# Patient Record
Sex: Male | Born: 1970
Health system: Southern US, Community
[De-identification: ages and names within clinical notes are randomized; demographics above are authoritative.]

## PROBLEM LIST (undated history)

## (undated) DIAGNOSIS — J302 Other seasonal allergic rhinitis: Secondary | ICD-10-CM

## (undated) HISTORY — DX: Other seasonal allergic rhinitis: J30.2

---

## 1991-11-02 LAB — HM COLONOSCOPY

## 2011-07-19 ENCOUNTER — Encounter: Payer: Self-pay | Admitting: Internal Medicine

## 2011-07-19 ENCOUNTER — Ambulatory Visit (INDEPENDENT_AMBULATORY_CARE_PROVIDER_SITE_OTHER): Payer: Self-pay | Admitting: Internal Medicine

## 2011-07-19 DIAGNOSIS — Z0000 Encounter for general adult medical examination without abnormal findings: Secondary | ICD-10-CM | POA: Insufficient documentation

## 2011-07-19 LAB — CBC WITH DIFFERENTIAL/PLATELET
Basophils Absolute: 0 10*3/uL (ref 0.0–0.1)
HCT: 43.9 % (ref 39.0–52.0)
Hemoglobin: 14.7 g/dL (ref 13.0–17.0)
Lymphs Abs: 1.3 10*3/uL (ref 0.7–4.0)
MCV: 85.9 fl (ref 78.0–100.0)
Monocytes Absolute: 0.4 10*3/uL (ref 0.1–1.0)
Monocytes Relative: 5.8 % (ref 3.0–12.0)
Neutro Abs: 4.6 10*3/uL (ref 1.4–7.7)
Platelets: 227 10*3/uL (ref 150.0–400.0)
RDW: 13.2 % (ref 11.5–14.6)

## 2011-07-19 LAB — COMPREHENSIVE METABOLIC PANEL
ALT: 26 U/L (ref 0–53)
Alkaline Phosphatase: 50 U/L (ref 39–117)
CO2: 28 mEq/L (ref 19–32)
Creatinine, Ser: 1.2 mg/dL (ref 0.4–1.5)
GFR: 73.76 mL/min (ref >60.00)
Glucose, Bld: 89 mg/dL (ref 70–99)
Total Bilirubin: 0.8 mg/dL (ref 0.3–1.2)

## 2011-07-19 LAB — LIPID PANEL
Cholesterol: 186 mg/dL (ref 0–200)
HDL: 41.6 mg/dL (ref >39.00)
LDL Cholesterol: 117 mg/dL — ABNORMAL HIGH (ref 0–99)
VLDL: 27.4 mg/dL (ref 0.0–40.0)

## 2011-07-19 NOTE — Assessment & Plan Note (Addendum)
Td ~ 2003  EKG without acute changes Diet , exercise discussed  Labs Has a # of moles , saw derm last week, aware of how monitor them

## 2011-07-19 NOTE — Progress Notes (Signed)
  Subjective:    Patient ID: Robert [name], male    DOB: 05/15/1971, 40 y.o.   MRN: [id number]  HPI New pt, CPX Concerned about his wt, on average it has been 220, at some point dropped to 200 lb, now up to 227  Past Medical History  Diagnosis Date  . Asthma dx aprox 2002    exercise induced , rarely has sx   . Seasonal allergies    History reviewed. No pertinent past surgical history.  History   Social History  . Marital Status: Married    Spouse Name: N/A    Number of Children: 2  . Years of Education: N/A   Occupational History  . attorney (immigration)    Social History Main Topics  . Smoking status: Never Smoker   . Smokeless tobacco: Never Used  . Alcohol Use: Yes     rarely   . Drug Use: No  . Sexually Active: Not on file   Other Topics Concern  . Not on file   Social History Narrative   Exercise: on-off ---diet: eats out a lot    Family History  Problem Relation Age of Onset  . Rheum arthritis Mother   . Dystonia Mother   . COPD Father   . Alcohol abuse Father   . Arthritis Other   . Hyperlipidemia Other   . Heart disease Other     no early dz   . Hypertension Other   . Colon cancer Neg Hx   . Prostate cancer Neg Hx     Review of Systems  Constitutional: Negative for fever and fatigue.  Respiratory:       Some sx allergy related   Cardiovascular: Negative for chest pain and leg swelling.  Gastrointestinal: Negative for abdominal pain and blood in stool.  Genitourinary: Negative for hematuria and difficulty urinating.  Psychiatric/Behavioral:       No anxiety-depression       Objective:   Physical Exam  Constitutional: He is oriented to person, place, and time. He appears well-developed. No distress.  HENT:  Head: Normocephalic and atraumatic.  Neck: No thyromegaly present.       Nl carotid pulse   Cardiovascular: Normal rate, regular rhythm and normal heart sounds.   No murmur heard. Pulmonary/Chest: Effort normal and breath sounds  normal. No respiratory distress. He has no wheezes. He has no rales.  Abdominal: Soft. Bowel sounds are normal. He exhibits no distension. There is no tenderness. There is no rebound and no guarding.  Musculoskeletal: He exhibits no edema.  Neurological: He is alert and oriented to person, place, and time.  Skin: Skin is warm and dry. He is not diaphoretic.  Psychiatric: He has a normal mood and affect. Judgment and thought content normal.          Assessment & Plan:

## 2011-07-22 ENCOUNTER — Encounter: Payer: Self-pay | Admitting: *Deleted

## 2011-11-02 HISTORY — PX: VASECTOMY: SHX75

## 2012-06-28 ENCOUNTER — Ambulatory Visit (INDEPENDENT_AMBULATORY_CARE_PROVIDER_SITE_OTHER): Payer: BC Managed Care – PPO | Admitting: Internal Medicine

## 2012-06-28 ENCOUNTER — Encounter: Payer: Self-pay | Admitting: Internal Medicine

## 2012-06-28 VITALS — BP 110/82 | HR 63 | Temp 98.9°F | Wt 225.4 lb

## 2012-06-28 DIAGNOSIS — S6992XA Unspecified injury of left wrist, hand and finger(s), initial encounter: Secondary | ICD-10-CM

## 2012-06-28 DIAGNOSIS — S59909A Unspecified injury of unspecified elbow, initial encounter: Secondary | ICD-10-CM

## 2012-06-28 MED ORDER — TRAMADOL HCL 50 MG PO TABS
50.0000 mg | ORAL_TABLET | Freq: Four times a day (QID) | ORAL | Status: AC | PRN
Start: 2012-06-28 — End: 2012-07-08
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
## 2012-06-28 NOTE — Patient Instructions (Addendum)
Consider glucosamine sulfate 1500 mg daily for joint symptoms until seen. This will rehydrate the cartilages. Use an anti-inflammatory cream such as Aspercreme or Zostrix cream twice a day to the area as needed. In lieu of this warm moist compresses or  hot water bottle can be used. Do not apply ice .  Review and correct the record as indicated. Please share record with all medical staff seen.   If you activate My Chart; the results can be released to you as soon as they populate from the lab. If you choose not to use this program; the labs have to be reviewed, copied & mailed   causing a delay in getting the results to you.

## 2013-09-17 ENCOUNTER — Telehealth: Payer: Self-pay

## 2013-09-17 NOTE — Telephone Encounter (Signed)
Reviewed data with spouse on DPR Medication List and allergies: reviewed (no allergies or any medications)  90 day supply/mail order: na Local prescriptions: [facility] [address]  Immunizations due:  Tdap  A/P:   No changes to FH or PSH Flu vaccine 08/2013  To Discuss with Provider: Not at this time

## 2013-09-18 ENCOUNTER — Ambulatory Visit (INDEPENDENT_AMBULATORY_CARE_PROVIDER_SITE_OTHER): Payer: BC Managed Care – PPO | Admitting: Internal Medicine

## 2013-09-18 ENCOUNTER — Encounter: Payer: Self-pay | Admitting: Internal Medicine

## 2013-09-18 VITALS — BP 122/79 | HR 78 | Temp 98.3°F | Ht 72.4 in | Wt 232.0 lb

## 2013-09-18 DIAGNOSIS — Z0000 Encounter for general adult medical examination without abnormal findings: Secondary | ICD-10-CM

## 2013-09-18 DIAGNOSIS — Z23 Encounter for immunization: Secondary | ICD-10-CM

## 2013-09-18 NOTE — Progress Notes (Signed)
Pre visit review using our clinic review tool, if applicable. No additional management support is needed unless otherwise documented below in the visit note. 

## 2013-09-18 NOTE — Assessment & Plan Note (Signed)
Td today Had a flu shot  Diet , exercise discussed , making some progress  Labs Other issues: Resolved L testicular mild pain w/ (-) GU ROS; minimal discomfort today exam, no mass. We talk about ultrasound to be sure scrotal contents are completely normal versus observation with a low threshold to proceed with ultrasound. He elected to wait and see. He will let me know if sx resurface

## 2013-09-18 NOTE — Progress Notes (Signed)
  Subjective:    Patient ID: Robert [name], male    DOB: 02/23/1971, 42 y.o.   MRN: [id number]  HPI CPX Also ~ 3 weeks ago  had left testicular discomfort for 2 days, symptoms self resolved. Symptoms started shortly after he started to work out and wonders if that is related. There was no swelling and self-examination was normal.   Past Medical History  Diagnosis Date  . Asthma dx aprox 2002    exercise induced , rarely has sx   . Seasonal allergies    Past Surgical History  Procedure Laterality Date  . Vasectomy  2013   History   Social History  . Marital Status: Married    Spouse Name: N/A    Number of Children: 2  . Years of Education: N/A   Occupational History  . attorney (immigration)    Social History Main Topics  . Smoking status: Never Smoker   . Smokeless tobacco: Never Used  . Alcohol Use: Yes     Comment: rarely   . Drug Use: No  . Sexual Activity: Not on file   Other Topics Concern  . Not on file   Social History Narrative  . No narrative on file   \ Family History  Problem Relation Age of Onset  . Rheum arthritis Mother   . Dystonia Mother   . COPD Father   . Alcohol abuse Father   . Arthritis Other   . Hyperlipidemia Other   . Heart disease Other     no early dz   . Hypertension Other   . Colon cancer Neg Hx   . Prostate cancer Neg Hx      Review of Systems Diet-- average to healthy Exercise--  Routine, 30 min 5 times a week No  CP, SOB, lower extremity edema Denies  nausea, vomiting diarrhea Denies  blood in the stools (-) cough, sputum production, (-) wheezing, chest congestion  + seasonal allergies sometimes, no sx recently No dysuria, gross hematuria, difficulty urinating, GU rash, no d/c            Objective:   Physical Exam BP 122/79  Pulse 78  Temp(Src) 98.3 F (36.8 C)  Ht 6' 0.4" (1.839 m)  Wt 232 lb (105.235 kg)  BMI 31.12 kg/m2  SpO2 99% General -- alert, well-developed, NAD.  Neck --no thyromegaly  Lungs  -- normal respiratory effort, no intercostal retractions, no accessory muscle use, and normal breath sounds.  Heart-- normal rate, regular rhythm, no murmur.  Abdomen-- Not distended, good bowel sounds,soft, non-tender. GU-- Penis without rash or discharge. Close to the meatus he has two dark skin spots, unchanged for years according to the patient. Scrotal contents normal except for mild tenderness bilaterally. Extremities-- no pretibial edema bilaterally  Neurologic--  alert & oriented X3. Speech normal, gait normal, strength normal in all extremities. Psych-- Cognition and judgment appear intact. Cooperative with normal attention span and concentration. No anxious appearing , no depressed appearing.      Assessment & Plan:

## 2013-09-18 NOTE — Patient Instructions (Signed)
Schedule your blood work before you leave FLP, CMP, CBC, TSH ---  dx V70  Next visit in 1 year for a physical exam    If you need more information about a healthy diet, the American Heart Association is a great resource online at:  [employer]101.pl    Testicular Self-Exam A self-examination of your testicles involves looking at and feeling your testicles for abnormal lumps or swelling. Several things can cause swelling, lumps, or pain in your testicles. Some of these causes are:  Injuries.  Inflammation.  Infection.  Accumulation of fluids around your testicle (hydrocele).  Twisted testicles (testicular torsion).  Testicular cancer. Self-examination of the testicles and groin areas may be advised if you are at risk for testicular cancer. Risks for testicular cancer include:  An undescended testicle (cryptorchidism).  A history of previous testicular cancer.  A family history of testicular cancer. The testicles are easiest to examine after warm baths or showers and are more difficult to examine when you are cold. This is because the muscles attached to the testicles retract and pull them up higher or into the abdomen. Follow these steps while you are standing:  Hold your penis away from your body.  Roll one testicle between your thumb and forefinger, feeling the entire testicle.  Roll the other testicle between your thumb and forefinger, feeling the entire testicle. Feel for lumps, swelling, or discomfort. A normal testicle is egg shaped and feels firm. It is smooth and not tender. The spermatic cord can be felt as a firm spaghetti-like cord at the back of your testicle. It is also important to examine the crease between the front of your leg and your abdomen. Feel for any bumps that are tender. These could be enlarged lymph nodes.  Document Released: 01/24/2001 Document Revised: 06/20/2013 Document Reviewed: 04/09/2013 [facility] Patient Information 2014  [place], [place].

## 2013-09-19 ENCOUNTER — Other Ambulatory Visit (INDEPENDENT_AMBULATORY_CARE_PROVIDER_SITE_OTHER): Payer: BC Managed Care – PPO

## 2013-09-19 DIAGNOSIS — Z0000 Encounter for general adult medical examination without abnormal findings: Secondary | ICD-10-CM

## 2013-09-19 LAB — CBC WITH DIFFERENTIAL/PLATELET
Basophils Relative: 0.2 % (ref 0.0–3.0)
Eosinophils Absolute: 0.2 10*3/uL (ref 0.0–0.7)
Hemoglobin: 14.9 g/dL (ref 13.0–17.0)
Lymphocytes Relative: 18.2 % (ref 12.0–46.0)
MCHC: 34.6 g/dL (ref 30.0–36.0)
Neutro Abs: 5.4 10*3/uL (ref 1.4–7.7)
RBC: 5.24 Mil/uL (ref 4.22–5.81)
RDW: 13.4 % (ref 11.5–14.6)

## 2013-09-19 LAB — COMPREHENSIVE METABOLIC PANEL
ALT: 34 U/L (ref 0–53)
AST: 23 U/L (ref 0–37)
Alkaline Phosphatase: 47 U/L (ref 39–117)
BUN: 20 mg/dL (ref 6–23)
Calcium: 9.3 mg/dL (ref 8.4–10.5)
Chloride: 103 mEq/L (ref 96–112)
Creatinine, Ser: 1.2 mg/dL (ref 0.4–1.5)
Sodium: 137 mEq/L (ref 135–145)
Total Bilirubin: 0.9 mg/dL (ref 0.3–1.2)
Total Protein: 7.3 g/dL (ref 6.0–8.3)

## 2013-09-19 LAB — LIPID PANEL
Cholesterol: 188 mg/dL (ref 0–200)
HDL: 35.2 mg/dL — ABNORMAL LOW (ref >39.00)
LDL Cholesterol: 123 mg/dL — ABNORMAL HIGH (ref 0–99)
Triglycerides: 151 mg/dL — ABNORMAL HIGH (ref 0.0–149.0)
VLDL: 30.2 mg/dL (ref 0.0–40.0)

## 2013-09-19 LAB — TSH: TSH: 2.27 u[IU]/mL (ref 0.35–5.50)

## 2013-09-21 ENCOUNTER — Encounter: Payer: Self-pay | Admitting: *Deleted

## 2014-11-28 ENCOUNTER — Encounter: Payer: Self-pay | Admitting: Medical

## 2014-11-28 ENCOUNTER — Ambulatory Visit (INDEPENDENT_AMBULATORY_CARE_PROVIDER_SITE_OTHER): Payer: 59 | Admitting: Medical

## 2014-11-28 VITALS — BP 122/80 | HR 77 | Temp 98.2°F | Ht 72.4 in | Wt 210.0 lb

## 2014-11-28 DIAGNOSIS — H6507 Acute serous otitis media, recurrent, unspecified ear: Secondary | ICD-10-CM

## 2014-11-28 MED ORDER — AMOXICILLIN-POT CLAVULANATE 875-125 MG PO TABS: 1.0000 | ORAL_TABLET | Freq: Two times a day (BID) | ORAL | Status: DC

## 2014-11-28 MED ORDER — FLUTICASONE PROPIONATE 50 MCG/ACT NA SUSP: 2.0000 | Freq: Every day | NASAL | Status: DC

## 2014-11-28 NOTE — Assessment & Plan Note (Signed)
With your recent nasal congestion, scuba diving and air flights, you have likley eustachian tube dysfunction along with bilateral OM. Your rt tm looks worse.(Your TM are intact).  I am prescribing flonase nasal spray and augmentin antibiotic.

## 2014-11-28 NOTE — Patient Instructions (Signed)
With your recent nasal congestion, scuba diving and air flights, you have likley eustachian tube dysfunction along with bilateral OM. Your rt tm looks worse.(Your TM are intact).  I am prescribing flonase nasal spray and augmentin antibiotic.  Follow up in 7 days or as needed.

## 2014-11-28 NOTE — Progress Notes (Signed)
Subjective:    Patient ID: Robert [name], male    DOB: 04/14/1971, 44 y.o.   MRN: [id number]  HPI   Pt in states he has some ear pressure. He was scuba diving and he felt ear pressure while scuba diving and he felt like he did not come up correctly. He did not follow instruction. He also had some mild congestion at the time. Pt got [place] from [place] last [place]. Sunday and Monday he scuba dived.   He flew back last night.  No fever or chills. Pt hearing intact.   Past Medical History  Diagnosis Date  . Asthma dx aprox 2002    exercise induced , rarely has sx   . Seasonal allergies     History   Social History  . Marital Status: Married    Spouse Name: N/A    Number of Children: 2  . Years of Education: N/A   Occupational History  . attorney (immigration)    Social History Main Topics  . Smoking status: Never Smoker   . Smokeless tobacco: Never Used  . Alcohol Use: Yes     Comment: rarely   . Drug Use: No  . Sexual Activity: Not on file   Other Topics Concern  . Not on file   Social History Narrative    Past Surgical History  Procedure Laterality Date  . Vasectomy  2013    Family History  Problem Relation Age of Onset  . Rheum arthritis Mother   . Dystonia Mother   . COPD Father   . Alcohol abuse Father   . Arthritis Other   . Hyperlipidemia Other   . Heart disease Other     no early dz   . Hypertension Other   . Colon cancer Neg Hx   . Prostate cancer Neg Hx     No Known Allergies  No current outpatient prescriptions on file prior to visit.   No current facility-administered medications on file prior to visit.    BP 122/80 mmHg  Pulse 68  Temp(Src) 98.2 F (36.8 C) (Oral)  Ht 6' 0.4" (1.839 m)  Wt 210 lb (95.255 kg)  BMI 28.17 kg/m2  SpO2 98%        Review of Systems  Constitutional: Negative for fever, chills and fatigue.  HENT: Positive for congestion and ear pain. Negative for mouth sores, postnasal drip,  sinus pressure, sneezing, sore throat, tinnitus and voice change.        More pressure on both sides ears.  Respiratory: Negative for cough, chest tightness, shortness of breath and wheezing.   Cardiovascular: Negative for chest pain and palpitations.  Musculoskeletal: Negative for back pain.  Neurological: Negative for dizziness, tremors, speech difficulty, numbness and headaches.  Psychiatric/Behavioral: Negative for behavioral problems and confusion.       Objective:   Physical Exam   General  Mental Status - Alert. General Appearance - Well groomed. Not in acute distress.  Skin Rashes- No Rashes.  HEENT Head- Normal. Ear Auditory Canal - Left- Normal. Right - Normal.Tympanic Membrane- Left- mild dull lt tm. Right- Very bright red tm. Tm intact.  Eye Sclera/Conjunctiva- Left- Normal. Right- Normal. Nose & Sinuses Nasal Mucosa- Left-  Boggy and Congested. Right-  Boggy and  Congested.No bilateral maxillary but no frontal sinus pressure. Mouth & Throat Lips: Upper Lip- Normal: no dryness, cracking, pallor, cyanosis, or vesicular eruption. Lower Lip-Normal: no dryness, cracking, pallor, cyanosis or vesicular eruption. Buccal Mucosa- Bilateral- No Aphthous  ulcers. Oropharynx- No Discharge or Erythema. Tonsils: Characteristics- Bilateral- No Erythema or Congestion. Size/Enlargement- Bilateral- No enlargement. Discharge- bilateral-None.  Neck Neck- Supple. No Masses.   Chest and Lung Exam Auscultation: Breath Sounds:-Clear even and unlabored.  Cardiovascular Auscultation:Rythm- Regular, rate and rhythm. Murmurs & Other Heart Sounds:Ausculatation of the heart reveal- No Murmurs.  Lymphatic Head & Neck General Head & Neck Lymphatics: Bilateral: Description- No Localized lymphadenopathy.         Assessment & Plan:

## 2014-11-28 NOTE — Progress Notes (Signed)
Pre visit review using our clinic review tool, if applicable. No additional management support is needed unless otherwise documented below in the visit note. 

## 2016-09-01 ENCOUNTER — Encounter: Payer: Self-pay | Admitting: Family

## 2016-09-01 ENCOUNTER — Ambulatory Visit (INDEPENDENT_AMBULATORY_CARE_PROVIDER_SITE_OTHER): Payer: 59 | Admitting: Family

## 2016-09-01 VITALS — BP 128/81 | HR 68 | Temp 98.4°F | Resp 18 | Ht 71.75 in | Wt 226.0 lb

## 2016-09-01 DIAGNOSIS — M109 Gout, unspecified: Secondary | ICD-10-CM | POA: Diagnosis not present

## 2016-09-01 DIAGNOSIS — Z23 Encounter for immunization: Secondary | ICD-10-CM

## 2016-09-01 DIAGNOSIS — B353 Tinea pedis: Secondary | ICD-10-CM | POA: Diagnosis not present

## 2016-09-01 LAB — URIC ACID: URIC ACID, SERUM: 5.5 mg/dL (ref 4.0–7.8)

## 2016-09-01 MED ORDER — COLCHICINE 0.6 MG PO TABS: ORAL_TABLET | ORAL | 0 refills | Status: DC

## 2016-09-01 MED ORDER — KETOROLAC TROMETHAMINE 60 MG/2ML IM SOLN
60.0000 mg | Freq: Once | INTRAMUSCULAR | Status: AC
  Administered 2016-09-01: 60 mg via INTRAMUSCULAR

## 2016-09-01 NOTE — Patient Instructions (Signed)
Please begin colchicine this evening. Try to avoid shellfish and beef and follow a gout diet. Call if new/worsening symptoms or if not improved in 2 days. Complete lab work prior to leaving. For athlete's foot you can purchase lamisil spray over the counter and apply to both feet twice daily.

## 2016-09-01 NOTE — Progress Notes (Signed)
   Subjective:    Patient ID: Robert [name], male    DOB: 04/18/1971, 45 y.o.   MRN: [id number]  HPI  Robert [name] is a 45 yr old male who presents today with chief complaint of left foot pain.  Pain began yesterday.  Notes that he started the "whole 30" diet pain 18 days ago. Reports that his pain is quite significant.  Tried ibuprofen which did not help.   Review of Systems    see HPI  Past Medical History:  Diagnosis Date  . Asthma dx aprox 2002   exercise induced , rarely has sx   . Seasonal allergies      Social History   Social History  . Marital status: Married    Spouse name: N/A  . Number of children: 2  . Years of education: N/A   Occupational History  . attorney (immigration)    Social History Main Topics  . Smoking status: Never Smoker  . Smokeless tobacco: Never Used  . Alcohol use Yes     Comment: rarely   . Drug use: No  . Sexual activity: Not on file   Other Topics Concern  . Not on file   Social History Narrative  . No narrative on file    Past Surgical History:  Procedure Laterality Date  . VASECTOMY  2013    Family History  Problem Relation Age of Onset  . Arthritis Other   . Hyperlipidemia Other   . Heart disease Other     no early dz   . Hypertension Other   . Rheum arthritis Mother   . Dystonia Mother   . COPD Father   . Alcohol abuse Father   . Colon cancer Neg Hx   . Prostate cancer Neg Hx     No Known Allergies  No current outpatient prescriptions on file prior to visit.   No current facility-administered medications on file prior to visit.     BP 128/81 (BP Location: Right Arm, Patient Position: Sitting, Cuff Size: Normal)   Pulse 68   Temp 98.4 F (36.9 C) (Oral)   Resp 18   Ht 5' 11.75" (1.822 m)   Wt 226 lb (102.5 kg)   SpO2 99% Comment: RA  BMI 30.87 kg/m    Objective:   Physical Exam  Constitutional: He is oriented to person, place, and time. He appears well-developed and well-nourished. No distress.   HENT:  Head: Normocephalic and atraumatic.  Cardiovascular: Normal rate and regular rhythm.   No murmur heard. Pulmonary/Chest: Effort normal and breath sounds normal. No respiratory distress. He has no wheezes. He has no rales.  Musculoskeletal: He exhibits no edema.  Mild erythema at the base of the left great toe.   Neurological: He is alert and oriented to person, place, and time.  Skin: Skin is warm and dry.  Dry peeling skin noted on left foot.   Psychiatric: He has a normal mood and affect. His behavior is normal. Thought content normal.          Assessment & Plan:  Tinea pedis- recommended otc lamisil spray.   Gout- new. Pt given IM toradol today in the office. He will take colchicine when he gets home this evening. I advised him that this will cause him some brief diarrhea.  We discussed low purine diet.  He is advised to call if new/worsening symptoms or if symptoms do not improve. Check uric acid level.

## 2016-09-01 NOTE — Progress Notes (Signed)
Pre visit review using our clinic review tool, if applicable. No additional management support is needed unless otherwise documented below in the visit note. 

## 2016-09-10 ENCOUNTER — Encounter: Payer: Self-pay | Admitting: Internal Medicine

## 2016-09-10 ENCOUNTER — Ambulatory Visit (INDEPENDENT_AMBULATORY_CARE_PROVIDER_SITE_OTHER): Payer: 59 | Admitting: Internal Medicine

## 2016-09-10 VITALS — BP 120/78 | HR 76 | Temp 98.7°F | Resp 14 | Ht 71.75 in | Wt 223.4 lb

## 2016-09-10 DIAGNOSIS — Z09 Encounter for follow-up examination after completed treatment for conditions other than malignant neoplasm: Secondary | ICD-10-CM | POA: Insufficient documentation

## 2016-09-10 DIAGNOSIS — R05 Cough: Secondary | ICD-10-CM

## 2016-09-10 DIAGNOSIS — R059 Cough, unspecified: Secondary | ICD-10-CM

## 2016-09-10 MED ORDER — AZELASTINE HCL 0.1 % NA SOLN: 2.0000 | Freq: Every evening | NASAL | 3 refills | Status: DC | PRN

## 2016-09-10 MED ORDER — AZITHROMYCIN 250 MG PO TABS: ORAL_TABLET | ORAL | 0 refills | Status: DC

## 2016-09-10 MED ORDER — BENZONATATE 200 MG PO CAPS: 200.0000 mg | ORAL_CAPSULE | Freq: Three times a day (TID) | ORAL | 0 refills | Status: DC | PRN

## 2016-09-10 NOTE — Assessment & Plan Note (Signed)
Cough: Persistent for 10 days, DDX allergies with PND, atypical infections, others. Conservative treatment first, see instructions, if not better will get an antibiotic. No evidence of asthma exacerbation on today's visit. Also, RTC at his convenience for a CPX

## 2016-09-10 NOTE — Progress Notes (Signed)
Pre visit review using our clinic review tool, if applicable. No additional management support is needed unless otherwise documented below in the visit note. 

## 2016-09-10 NOTE — Progress Notes (Signed)
Subjective:    Patient ID: Robert [name], male    DOB: 06/30/1971, 45 y.o.   MRN: [id number]  DOS:  09/10/2016 Type of visit Symptoms started ~ 10 days ago, is mostly spells of cough, often times at night, was unable to sleep well last night. No sputum production. Wife had a URI. Has tried Benadryl, Flonase and Mucinex   Review of Systems Denies fever chills As far as allergies, he has some postnasal dripping but is not congesting, no itchy eyes or nose, no sneezing. Denies nausea, vomiting, or aches like a flu syndrome. No wheezing or heartburn  Past Medical History:  Diagnosis Date  . Asthma dx aprox 2002   exercise induced , rarely has sx   . Seasonal allergies     Past Surgical History:  Procedure Laterality Date  . VASECTOMY  2013    Social History   Social History  . Marital status: Married    Spouse name: N/A  . Number of children: 2  . Years of education: N/A   Occupational History  . attorney (immigration)    Social History Main Topics  . Smoking status: Never Smoker  . Smokeless tobacco: Never Used  . Alcohol use Yes     Comment: rarely   . Drug use: No  . Sexual activity: Not on file   Other Topics Concern  . Not on file   Social History Narrative  . No narrative on file        Medication List       Accurate as of 09/10/16  5:02 PM. Always use your most recent med list.          azelastine 0.1 % nasal spray Commonly known as:  ASTELIN Place 2 sprays into both nostrils at bedtime as needed for rhinitis. Use in each nostril as directed   azithromycin 250 MG tablet Commonly known as:  ZITHROMAX Z-PAK 2 tabs a day the first day, then 1 tab a day x 4 days   benzonatate 200 MG capsule Commonly known as:  TESSALON Take 1 capsule (200 mg total) by mouth 3 (three) times daily as needed for cough.   colchicine 0.6 MG tablet 2 tabs today, followed by 1 tab one hour later.  May repeat tomorrow if symptoms not improved.            Objective:   Physical Exam BP 120/78 (BP Location: Left Arm, Patient Position: Sitting, Cuff Size: Normal)   Pulse 76   Temp 98.7 F (37.1 C) (Oral)   Resp 14   Ht 5' 11.75" (1.822 m)   Wt 223 lb 6 oz (101.3 kg)   SpO2 97%   BMI 30.51 kg/m  General:   Well developed, well nourished . NAD.  HEENT:  Normocephalic . Face symmetric, atraumatic. TMs normal. Throat symmetric and not red. Nose slightly congested, sinuses no TTP Lungs:  CTA B Normal respiratory effort, no intercostal retractions, no accessory muscle use. Heart: RRR,  no murmur.  No pretibial edema bilaterally  Skin: Not pale. Not jaundice Neurologic:  alert & oriented X3.  Speech normal, gait appropriate for age and unassisted Psych--  Cognition and judgment appear intact.  Cooperative with normal attention span and concentration.  Behavior appropriate. No anxious or depressed appearing.      Assessment & Plan:   Assessment Asthma [facility] 09-2016 Seasonal allergies  PLAN: Cough: Persistent for 10 days, DDX allergies with PND, atypical infections, others. Conservative treatment first, see instructions, if not  better will get an antibiotic. No evidence of asthma exacerbation on today's visit. Also, RTC at his convenience for a CPX

## 2016-09-10 NOTE — Patient Instructions (Signed)
  For cough:  Take Mucinex DM twice a day as needed until better If the cough continue, try Tessalon perless   For nasal congestion: Use OTC Nasocort or Flonase : 2 nasal sprays on each side of the nose in the morning until you feel better Use ASTELIN a prescribed spray : 2 nasal sprays on each side of the nose at night until you feel better   Take the antibiotic as prescribed  (zithromax) if not better in 3-4 days   Call if not gradually better over the next  10 days  Call anytime if the symptoms are severe

## 2016-11-26 ENCOUNTER — Encounter: Payer: Self-pay | Admitting: Internal Medicine

## 2016-11-26 ENCOUNTER — Ambulatory Visit (INDEPENDENT_AMBULATORY_CARE_PROVIDER_SITE_OTHER): Payer: 59 | Admitting: Internal Medicine

## 2016-11-26 VITALS — BP 118/66 | HR 76 | Temp 97.6°F | Resp 14 | Ht 72.0 in | Wt 230.5 lb

## 2016-11-26 DIAGNOSIS — Z0000 Encounter for general adult medical examination without abnormal findings: Secondary | ICD-10-CM | POA: Diagnosis not present

## 2016-11-26 LAB — CBC WITH DIFFERENTIAL/PLATELET
BASOS ABS: 0 10*3/uL (ref 0.0–0.1)
BASOS PCT: 0.3 % (ref 0.0–3.0)
EOS ABS: 0.1 10*3/uL (ref 0.0–0.7)
Eosinophils Relative: 1.8 % (ref 0.0–5.0)
HEMATOCRIT: 43 % (ref 39.0–52.0)
HEMOGLOBIN: 14.8 g/dL (ref 13.0–17.0)
LYMPHS PCT: 23.5 % (ref 12.0–46.0)
Lymphs Abs: 1.6 10*3/uL (ref 0.7–4.0)
MCHC: 34.3 g/dL (ref 30.0–36.0)
MCV: 83.1 fl (ref 78.0–100.0)
Monocytes Absolute: 0.4 10*3/uL (ref 0.1–1.0)
Monocytes Relative: 6.1 % (ref 3.0–12.0)
NEUTROS ABS: 4.6 10*3/uL (ref 1.4–7.7)
Neutrophils Relative %: 68.3 % (ref 43.0–77.0)
PLATELETS: 218 10*3/uL (ref 150.0–400.0)
RBC: 5.18 Mil/uL (ref 4.22–5.81)
RDW: 12.9 % (ref 11.5–15.5)
WBC: 6.7 10*3/uL (ref 4.0–10.5)

## 2016-11-26 LAB — COMPREHENSIVE METABOLIC PANEL
ALBUMIN: 4.4 g/dL (ref 3.5–5.2)
ALT: 27 U/L (ref 0–53)
AST: 19 U/L (ref 0–37)
Alkaline Phosphatase: 48 U/L (ref 39–117)
BILIRUBIN TOTAL: 0.6 mg/dL (ref 0.2–1.2)
BUN: 19 mg/dL (ref 6–23)
CALCIUM: 9.3 mg/dL (ref 8.4–10.5)
CHLORIDE: 103 meq/L (ref 96–112)
CO2: 32 meq/L (ref 19–32)
Creatinine, Ser: 1.17 mg/dL (ref 0.40–1.50)
GFR: 71.24 mL/min (ref >60.00)
Glucose, Bld: 92 mg/dL (ref 70–99)
Potassium: 4.1 mEq/L (ref 3.5–5.1)
Sodium: 140 mEq/L (ref 135–145)
Total Protein: 7.3 g/dL (ref 6.0–8.3)

## 2016-11-26 LAB — LIPID PANEL
CHOLESTEROL: 191 mg/dL (ref 0–200)
HDL: 40.6 mg/dL (ref >39.00)
LDL CALC: 134 mg/dL — AB (ref 0–99)
NonHDL: 150.26
TRIGLYCERIDES: 81 mg/dL (ref 0.0–149.0)
Total CHOL/HDL Ratio: 5
VLDL: 16.2 mg/dL (ref 0.0–40.0)

## 2016-11-26 LAB — URIC ACID: Uric Acid, Serum: 5.9 mg/dL (ref 4.0–7.8)

## 2016-11-26 LAB — TSH: TSH: 2.11 u[IU]/mL (ref 0.35–4.50)

## 2016-11-26 NOTE — Patient Instructions (Signed)
GO TO THE LAB : Get the blood work     GO TO THE FRONT DESK Schedule your next appointment for a physical in 2 years

## 2016-11-26 NOTE — Progress Notes (Signed)
Pre visit review using our clinic review tool, if applicable. No additional management support is needed unless otherwise documented below in the visit note. 

## 2016-11-26 NOTE — Assessment & Plan Note (Addendum)
Td 2014, had a Flu shot  CCS: ?sigmoidoscopy in the 90s, no Cscope  Prostate ca screening- no FH,not indicated He is active and is trying to eat healthy. Feeling well. Labs: CMP, FLP, CBC, TSH, HIV, uric acid. RTC 2 years, sooner if needed. Recommend a flu shot yearly

## 2016-11-26 NOTE — Progress Notes (Signed)
Subjective:    Patient ID: Robert [name], male    DOB: May 04, 1971, 46 y.o.   MRN: [id number]  DOS:  11/26/2016 Type of visit - description : CPX Interval history: No concerns, doing great, lifestyle has improved   Review of Systems Constitutional: No fever. No chills. No unexplained wt changes. No unusual sweats  HEENT: No dental problems, no ear discharge, no facial swelling, no voice changes. No eye discharge, no eye  redness , no  intolerance to light   Respiratory: No wheezing , no  difficulty breathing. No cough , no mucus production  Cardiovascular: No CP, no leg swelling , no  Palpitations  GI: no nausea, no vomiting, no diarrhea , no  abdominal pain.  No blood in the stools. No dysphagia, no odynophagia    Endocrine: No polyphagia, no polyuria , no polydipsia  GU: No dysuria, gross hematuria, difficulty urinating. No urinary urgency, no frequency.  Musculoskeletal: No joint swellings or unusual aches or pains  Skin: No change in the color of the skin, palor , no  Rash  Allergic, immunologic: No environmental allergies , no  food allergies  Neurological: No dizziness no  syncope. No headaches. No diplopia, no slurred, no slurred speech, no motor deficits, no facial  Numbness  Hematological: No enlarged lymph nodes, no easy bruising , no unusual bleedings  Psychiatry: No suicidal ideas, no hallucinations, no beavior problems, no confusion.  No unusual/severe anxiety, no depression   Past Medical History:  Diagnosis Date  . Asthma dx aprox 2002   exercise induced , rarely has sx   . Seasonal allergies     Past Surgical History:  Procedure Laterality Date  . VASECTOMY  2013    Social History   Social History  . Marital status: Married    Spouse name: N/A  . Number of children: 2  . Years of education: N/A   Occupational History  . attorney (immigration)    Social History Main Topics  . Smoking status: Never Smoker  . Smokeless tobacco: Never  Used  . Alcohol use Yes     Comment: rarely   . Drug use: No  . Sexual activity: Not on file   Other Topics Concern  . Not on file   Social History Narrative  . No narrative on file     Family History  Problem Relation Age of Onset  . Arthritis Other   . Hyperlipidemia Other   . Heart disease Other     no early dz   . Hypertension Other   . Rheum arthritis Mother   . Dystonia Mother   . COPD Father   . Alcohol abuse Father   . Colon cancer Neg Hx   . Prostate cancer Neg Hx      Allergies as of 11/26/2016   No Known Allergies     Medication List       Accurate as of 11/26/16 11:59 PM. Always use your most recent med list.          colchicine 0.6 MG tablet 2 tabs today, followed by 1 tab one hour later.  May repeat tomorrow if symptoms not improved.          Objective:   Physical Exam BP 118/66 (BP Location: Left Arm, Patient Position: Sitting, Cuff Size: Normal)   Pulse 76   Temp 97.6 F (36.4 C) (Oral)   Resp 14   Ht 6' (1.829 m)   Wt 230 lb 8 oz (104.6 kg)  SpO2 98%   BMI 31.26 kg/m   General:   Well developed, well nourished . NAD.  Neck: No  thyromegaly  HEENT:  Normocephalic . Face symmetric, atraumatic Lungs:  CTA B Normal respiratory effort, no intercostal retractions, no accessory muscle use. Heart: RRR,  no murmur.  No pretibial edema bilaterally  Abdomen:  Not distended, soft, non-tender. No rebound or rigidity.   Skin: Exposed areas without rash. Not pale. Not jaundice Neurologic:  alert & oriented X3.  Speech normal, gait appropriate for age and unassisted Strength symmetric and appropriate for age.  Psych: Cognition and judgment appear intact.  Cooperative with normal attention span and concentration.  Behavior appropriate. No anxious or depressed appearing.    Assessment & Plan:    Assessment Asthma, dx 2012, exercise induced  Podagra 09-2016 Seasonal allergies  PLAN: Asthma: Not an issue at this point Podagra:  No recent events, will check a uric acid RTC 2 years

## 2016-11-27 LAB — HIV ANTIBODY (ROUTINE TESTING W REFLEX): HIV: NONREACTIVE

## 2016-11-28 NOTE — Assessment & Plan Note (Signed)
Asthma: Not an issue at this point Podagra: No recent events, will check a uric acid RTC 2 years

## 2017-04-05 ENCOUNTER — Encounter: Payer: Self-pay | Admitting: Family

## 2017-04-05 ENCOUNTER — Ambulatory Visit (INDEPENDENT_AMBULATORY_CARE_PROVIDER_SITE_OTHER): Payer: 59 | Admitting: Family

## 2017-04-05 VITALS — BP 126/82 | HR 54 | Temp 98.4°F | Resp 18 | Ht 72.0 in | Wt 230.6 lb

## 2017-04-05 DIAGNOSIS — N529 Male erectile dysfunction, unspecified: Secondary | ICD-10-CM

## 2017-04-05 DIAGNOSIS — R6882 Decreased libido: Secondary | ICD-10-CM

## 2017-04-05 DIAGNOSIS — E669 Obesity, unspecified: Secondary | ICD-10-CM

## 2017-04-05 LAB — TSH: TSH: 2.03 u[IU]/mL (ref 0.35–4.50)

## 2017-04-05 MED ORDER — SILDENAFIL CITRATE 50 MG PO TABS: 50.0000 mg | ORAL_TABLET | Freq: Every day | ORAL | 2 refills | Status: DC | PRN

## 2017-04-05 NOTE — Patient Instructions (Signed)
Please complete lab work prior to leaving. You may use 1/2 to 1 tablet of viagra as needed.
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
## 2017-04-06 LAB — TESTOSTERONE TOTAL,FREE,BIO, MALES
ALBUMIN: 4.4 g/dL (ref 3.6–5.1)
Sex Hormone Binding: 26 nmol/L (ref 10–50)
TESTOSTERONE FREE: 62.9 pg/mL (ref 46.0–224.0)
TESTOSTERONE: 395 ng/dL (ref 250–827)
Testosterone, Bioavailable: 126.6 ng/dL (ref 110.0–575.0)

## 2017-04-07 ENCOUNTER — Encounter: Payer: Self-pay | Admitting: Family

## 2017-06-01 ENCOUNTER — Telehealth: Payer: 59 | Admitting: Family

## 2017-06-01 ENCOUNTER — Ambulatory Visit: Payer: 59 | Admitting: Internal Medicine

## 2017-06-01 DIAGNOSIS — M791 Myalgia, unspecified site: Secondary | ICD-10-CM

## 2017-06-01 DIAGNOSIS — R300 Dysuria: Secondary | ICD-10-CM

## 2017-06-01 NOTE — Progress Notes (Signed)
We are sorry that you are not feeling well.  Here is how we plan to help!  Male bladder infections are not very common.  We worry about prostate or kidney conditions.  The standard of care is to examine the abdomen and kidneys, and to do a urine and blood test to make sure that something more serious is not going on.  We recommend that you see a provider today.  If your doctor's office is closed [facility] has the following Urgent Cares:  If you need care fast and have a high deductible or no insurance consider:   [email or web address]  [phone]  [address], [address] [place], [place] [address] 8 am to 8 pm Monday-Friday 10 am to 4 pm Saturday-Sunday   The following sites will take your  insurance:    . [facility]  [phone] Get Driving Directions Find a Provider at this Location  [address] [place], [place] [address] . 10 am to 8 pm Monday-Friday . 12 pm to 8 pm Saturday-Sunday   . [facility]  [phone] Get Driving Directions Find a Provider at this Location  [address], [address] [place], [place] [address] . 8 am to 8 pm Monday-Friday . 9 am to 6 pm Saturday . 11 am to 6 pm Sunday   . [facility] [place]  [phone] Get Driving Directions  [address] [place], [place] [address] . 8 am to 8 pm Monday-Friday . 8 am to 4 pm Saturday-Sunday   . [facility] (a walk in primary care provider)  [phone]  Get Driving Directions Find a Provider at this Location  [address] , [place] [address] . 8 am to 8:30 pm Monday-Thursday . 8 am to 6 pm Friday . 8 am to 4 pm Saturday-Sunday   Your e-visit answers were reviewed by a board certified advanced clinical practitioner to complete your personal care plan.  Thank you for using e-Visits.   

## 2017-06-02 ENCOUNTER — Telehealth: Payer: Self-pay | Admitting: Emergency Medicine

## 2017-06-02 NOTE — Telephone Encounter (Signed)
Pre visit attempted left voicemail for pt to return call to office.  

## 2017-06-03 ENCOUNTER — Ambulatory Visit (INDEPENDENT_AMBULATORY_CARE_PROVIDER_SITE_OTHER): Payer: 59 | Admitting: Family Medicine

## 2017-06-03 ENCOUNTER — Encounter: Payer: Self-pay | Admitting: Family Medicine

## 2017-06-03 VITALS — BP 134/80 | HR 98 | Temp 98.5°F | Ht 72.0 in | Wt 230.2 lb

## 2017-06-03 DIAGNOSIS — R509 Fever, unspecified: Secondary | ICD-10-CM | POA: Diagnosis not present

## 2017-06-03 DIAGNOSIS — R300 Dysuria: Secondary | ICD-10-CM

## 2017-06-03 DIAGNOSIS — R351 Nocturia: Secondary | ICD-10-CM | POA: Diagnosis not present

## 2017-06-03 LAB — POCT URINALYSIS DIPSTICK
Bilirubin, UA: NEGATIVE
Blood, UA: NEGATIVE
GLUCOSE UA: NEGATIVE
Ketones, UA: NEGATIVE
LEUKOCYTES UA: NEGATIVE
NITRITE UA: NEGATIVE
Protein, UA: 15
Spec Grav, UA: 1.015 (ref 1.010–1.025)
Urobilinogen, UA: 1 E.U./dL
pH, UA: 7 (ref 5.0–8.0)

## 2017-06-03 NOTE — Patient Instructions (Signed)
Please let me know if symptoms do not continue to improve or if the worsen If any fever over 101, nausea or vomiting over weekend- go to ER or urgent care

## 2017-06-03 NOTE — Progress Notes (Signed)
Subjective:    Patient ID: Robert [name], male    DOB: 11/20/1970, 46 y.o.   MRN: [id number]  HPI This is a 46 yo male who presents today to establish care and for concern for nocturia. He has seen Dr. Drue NovelPaz in the past but lives in [place] and this location is more convenient for him. He is an Air cabin crewimmigration lawyer. He is married with two children (teenagers).   Started 5 days ago with strange sensation with urine and frequency. Four days ago had clamminess/aches which resolved with ibuprofen. No back pain, no lower abdominal pain, no nausea or vomiting. No difficulty with stream or starting stream or emptying bladder. Had some urgency over 2 days. Had small amount of urinary incontinence 5 days ago. Does not recall having similar symptoms in the past. Has history of urethral meatus injury since college. Like a cut on inside of urethral meatus. Loraine LericheMark remains, no symptoms typically.  Did have some soreness of penis after intercourse with wife this past weekend. Wife with some similar urinary frequency that started today. No recent vaginal discharge or infection. In monogamous relationship with wife.      Past Medical History:  Diagnosis Date  . Asthma dx aprox 2002   exercise induced , rarely has sx   . Seasonal allergies    Past Surgical History:  Procedure Laterality Date  . VASECTOMY  2013   Family History  Problem Relation Age of Onset  . Arthritis Other   . Hyperlipidemia Other   . Heart disease Other        no early dz   . Hypertension Other   . Rheum arthritis Mother   . Dystonia Mother   . COPD Father   . Alcohol abuse Father   . Colon cancer Neg Hx   . Prostate cancer Neg Hx    Social History  Substance Use Topics  . Smoking status: Never Smoker  . Smokeless tobacco: Never Used  . Alcohol use Yes     Comment: rarely       Review of Systems  Constitutional: Positive for fever (low grade). Negative for chills.  Gastrointestinal: Negative for abdominal pain,  nausea and vomiting.  Endocrine: Negative.   Genitourinary: Positive for dysuria (not pain, feels "weird"), frequency and urgency. Negative for decreased urine volume, difficulty urinating, discharge, flank pain, hematuria, penile pain, penile swelling and testicular pain.  Musculoskeletal: Positive for myalgias (improved now).       Objective:   Physical Exam  Constitutional: He is oriented to person, place, and time. He appears well-developed and well-nourished. No distress.  HENT:  Head: Normocephalic and atraumatic.  Eyes: Conjunctivae are normal.  Cardiovascular: Normal rate, regular rhythm and normal heart sounds.   Pulmonary/Chest: Effort normal and breath sounds normal.  Abdominal: Soft. Bowel sounds are normal. He exhibits no distension. There is no tenderness. There is no rebound, no guarding and no CVA tenderness. Hernia confirmed negative in the right inguinal area and confirmed negative in the left inguinal area.  Genitourinary: Rectum normal and prostate normal. Right testis shows no mass, no swelling and no tenderness. Left testis shows no mass, no swelling and no tenderness. No penile erythema or penile tenderness. No discharge found.  Genitourinary Comments: 2 mm area increased pigmentation on right side of urethral meatus.   Lymphadenopathy:       Right: No inguinal adenopathy present.       Left: No inguinal adenopathy present.  Neurological: He is alert and oriented  to person, place, and time.  Skin: Skin is warm and dry. He is not diaphoretic.  Psychiatric: He has a normal mood and affect. His behavior is normal. Judgment and thought content normal.  Vitals reviewed.    BP 134/80 (BP Location: Right Arm, Patient Position: Sitting, Cuff Size: Normal)   Pulse 98   Temp 98.5 F (36.9 C) (Oral)   Ht 6' (1.829 m)   Wt 230 lb 3.2 oz (104.4 kg)   SpO2 97%   BMI 31.22 kg/m  Wt Readings from Last 3 Encounters:  06/03/17 230 lb 3.2 oz (104.4 kg)  04/05/17 230 lb 9.6  oz (104.6 kg)  11/26/16 230 lb 8 oz (104.6 kg)   Results for orders placed or performed in visit on 06/03/17  POCT urinalysis dipstick  Result Value Ref Range   Color, UA Yellow    Clarity, UA Clear    Glucose, UA Negative    Bilirubin, UA Negative    Ketones, UA Negative    Spec Grav, UA 1.015 1.010 - 1.025   Blood, UA Negative    pH, UA 7.0 5.0 - 8.0   Protein, UA 15    Urobilinogen, UA 1.0 0.2 or 1.0 E.U./dL   Nitrite, UA Negative    Leukocytes, UA Negative Negative        Assessment & Plan:  1. Nocturia - POCT urinalysis dipstick - Urine Culture  2. Dysuria - POCT urinalysis dipstick - Urine Culture  3. Low grade fever - POCT urinalysis dipstick - Urine Culture  - symptoms improving, urinalysis, history and physical exam reassuring. Will check urine culture. RTC/ED precautions reviewed.   Olean Reeeborah Thaniel Coluccio, FNP-BC  [facility] [place], [facility]  06/03/2017 4:29 PM

## 2017-06-04 LAB — URINE CULTURE: ORGANISM ID, BACTERIA: NO GROWTH

## 2017-08-31 ENCOUNTER — Telehealth: Payer: Self-pay | Admitting: Internal Medicine

## 2017-08-31 NOTE — Telephone Encounter (Signed)
Pt was not seen that day and refused to reschedule. Just an FYI.

## 2017-08-31 NOTE — Telephone Encounter (Signed)
Please call pt and advise [phone]. Pt incurred no show fee for arriving eleven minutes late to his appt 06/01/17. Pt states he did not NO SHOW he arrived one minute past the grace. Pt request we waive the fee.

## 2017-09-01 NOTE — Telephone Encounter (Signed)
Ok to wave

## 2017-09-02 NOTE — Telephone Encounter (Signed)
Emailed charge correction to have the fee removed per [facility].

## 2017-09-23 ENCOUNTER — Encounter: Payer: Self-pay | Admitting: Family Medicine

## 2017-09-23 ENCOUNTER — Ambulatory Visit: Payer: 59 | Admitting: Family Medicine

## 2017-09-23 DIAGNOSIS — A060 Acute amebic dysentery: Secondary | ICD-10-CM

## 2017-09-23 DIAGNOSIS — A078 Other specified protozoal intestinal diseases: Secondary | ICD-10-CM | POA: Diagnosis not present

## 2017-09-23 DIAGNOSIS — A029 Salmonella infection, unspecified: Secondary | ICD-10-CM | POA: Diagnosis not present

## 2017-09-23 NOTE — Assessment & Plan Note (Signed)
Symptoms improving Finish abx F/u pcp prn

## 2017-09-23 NOTE — Patient Instructions (Signed)
Salmonella Gastroenteritis, Adult °Salmonella gastroenteritis is an infection of the intestines that can cause nausea, vomiting, and other symptoms. The infection usually lasts from 2 to 7 days. °What are the causes? °This condition is caused by salmonella bacteria. These bacteria can spread through a person's stool. You can get this infection by: °· Eating food or drinking liquids that have the bacteria on it. °· Drinking polluted, standing water. °· Coming into contact with an animal that is carrying the bacteria. ° °What increases the risk? °This condition is more likely to develop in: °· Elderly adults. °· People with a weakened immune system. °· People with poor personal or kitchen hygiene. ° °What are the signs or symptoms? °Symptoms of this condition include: °· Nausea. °· Vomiting. °· Abdominal pain or cramps. °· Diarrhea, which may be bloody. °· Fever. °· A headache. ° °How is this diagnosed? °This condition may be diagnosed based on your medical history, a physical exam, and a blood or stool test. °How is this treated? °Often, the only treatment needed is to drink plenty of fluids. Drinking fluids is important because this infection can make you dehydrated. If your condition is severe, you may be prescribed an antibiotic medicine to help shorten the illness. °Most people recover completely, but some people may develop lasting problems such as arthritis, irritation of the eyes, or painful urination. °Follow these instructions at home: °Drinking °· Drink enough fluid to keep your urine clear or pale yellow. This helps prevent dehydration. °· Until your diarrhea, nausea, or vomiting is under control, drink only clear liquids. Clear liquids are liquids you can see through, such as water, broth, or non-caffeinated tea. Avoid milk, fruit juice, alcohol, and very hot or very cold drinks. °· If you are dehydrated, ask your health care provider for specific rehydration instructions. Signs of dehydration  include: °? Thirst. °? Dry lips or mouth. °? Dry, warm skin. °? Dark urine. °? A headache. °Eating and drinking °· If you are not hungry, do not force yourself to eat. °· If you are hungry, eat a well-balanced diet that includes: °? A variety of complex carbohydrates, such as rice, wheat, potatoes, and bread. °? Lean meats. °? Yogurt. °? Fruits. °? Vegetables. °· Avoid high-fat foods. They are difficult to digest. °· If you have diarrhea, do not prepare food. °Medicines °· Take over-the-counter and prescription medicines only as told by your health care provider. °· If you were prescribed an antibiotic medicine, take it as told by your health care provider. Do not stop taking the antibiotic even if you start to feel better. °Other Instructions ° °· Wash your hands well. This helps keep the bacteria from spreading to others. °· Keep track of changes in your weight. Losing a lot of weight can be a sign of a serious problem. Ask your health care provider how much weight loss should concern you. °· Keep all follow-up visits as told by your health care provider. °How is this prevented? °To prevent future salmonella infections: °· Handle meat, eggs, seafood, and poultry properly. °· Always cook meat, eggs, seafood, and poultry thoroughly. °· Wash your hands and counters thoroughly after handling or preparing meat, eggs, seafood, and poultry. °· Wash your hands thoroughly after handling animals. ° °Get help right away if: °· You cannot keep fluids down. °· You keep vomiting or having diarrhea. °· You have abdominal pain that gets worse. °· You have abdominal pain in one small area. °· Your diarrhea has more blood or mucus than   before. °· You have a fever. °· You have any symptoms of severe dehydration. These include: °? Extreme thirst. °? Confusion. °? Inability to sweat. °? Fainting. °? Dizziness. °? Weight loss. °This information is not intended to replace advice given to you by your health care provider. Make sure you  discuss any questions you have with your health care provider. °Document Released: 10/15/2000 Document Revised: 03/25/2016 Document Reviewed: 07/12/2015 °Elsevier Interactive Patient Education © 2018 [employer] Inc. ° °

## 2017-09-23 NOTE — Progress Notes (Signed)
Patient ID: Robert [name], male    DOB: 05/07/1971  Age: 46 y.o. MRN: [id number]    Subjective:  Subjective  HPI Robert [name] presents for f/u for salmonalla ,  And parasites from [place].  Last Sat he had traveled from [place] and started with headache --- then he had fever , diarrhea -- he was seen by dr in hotel who sent him to a private clinic and he was dx with salmonella and entamoeba histolytica-- he had 24 h ivf and abx and d/c with cipro ,  "secnidazol" and a probiotic   He also developed an unproductive cough --- it is better today.  Today is day 2 of cipro-- total of 5 days.      Review of Systems  Constitutional: Negative for chills and fever.  HENT: Positive for congestion, postnasal drip and sneezing. Negative for rhinorrhea, sinus pressure and sore throat.   Respiratory: Positive for cough, chest tightness, shortness of breath and wheezing.   Cardiovascular: Negative for chest pain, palpitations and leg swelling.  Allergic/Immunologic: Negative for environmental allergies.    History Past Medical History:  Diagnosis Date  . Asthma dx aprox 2002   exercise induced , rarely has sx   . Seasonal allergies     He has a past surgical history that includes Vasectomy (2013).   His family history includes Alcohol abuse in his father; Arthritis in his other; COPD in his father; Dystonia in his mother; Heart disease in his other; Hyperlipidemia in his other; Hypertension in his other; Rheum arthritis in his mother.He reports that  has never smoked. he has never used smokeless tobacco. He reports that he drinks alcohol. He reports that he does not use drugs.  Current Outpatient Medications on File Prior to Visit  Medication Sig Dispense Refill  . ciprofloxacin (CIPRO) 500 MG tablet Take 500 mg by mouth 2 (two) times daily.     No current facility-administered medications on file prior to visit.      Objective:  Objective  Physical Exam  Constitutional: He is  oriented to person, place, and time. He appears well-developed and well-nourished.  HENT:  Right Ear: External ear normal.  Left Ear: External ear normal.  Nose: Right sinus exhibits no maxillary sinus tenderness and no frontal sinus tenderness. Left sinus exhibits no maxillary sinus tenderness and no frontal sinus tenderness.  + PND + errythema  Eyes: Conjunctivae are normal. Right eye exhibits no discharge. Left eye exhibits no discharge.  Cardiovascular: Normal rate, regular rhythm and normal heart sounds.  No murmur heard. Pulmonary/Chest: Effort normal and breath sounds normal. No respiratory distress. He has no wheezes. He has no rales. He exhibits no tenderness.  Musculoskeletal: He exhibits no edema.  Lymphadenopathy:    He has no cervical adenopathy.  Neurological: He is alert and oriented to person, place, and time.  Nursing note and vitals reviewed.  BP 118/70   Temp 98.2 F (36.8 C)   Ht 6' (1.829 m)   BMI 31.22 kg/m  Wt Readings from Last 3 Encounters:  06/03/17 230 lb 3.2 oz (104.4 kg)  04/05/17 230 lb 9.6 oz (104.6 kg)  11/26/16 230 lb 8 oz (104.6 kg)     Lab Results  Component Value Date   WBC 6.7 11/26/2016   HGB 14.8 11/26/2016   HCT 43.0 11/26/2016   PLT 218.0 11/26/2016   GLUCOSE 92 11/26/2016   CHOL 191 11/26/2016   TRIG 81.0 11/26/2016   HDL 40.60 11/26/2016  LDLCALC 134 (H) 11/26/2016   ALT 27 11/26/2016   AST 19 11/26/2016   NA 140 11/26/2016   K 4.1 11/26/2016   CL 103 11/26/2016   CREATININE 1.17 11/26/2016   BUN 19 11/26/2016   CO2 32 11/26/2016   TSH 2.03 04/05/2017    Patient was never admitted.   Assessment & Plan:  Plan  I am having Robert [name] maintain his ciprofloxacin.  No orders of the defined types were placed in this encounter.   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  Donato SchultzYvonne R Lowne Chase, DO

## 2017-09-23 NOTE — Assessment & Plan Note (Signed)
Symptoms improving Finish meds F/u pcp prn

## 2017-09-29 ENCOUNTER — Encounter: Payer: Self-pay | Admitting: Internal Medicine

## 2017-09-30 ENCOUNTER — Ambulatory Visit: Payer: 59 | Admitting: Internal Medicine

## 2017-09-30 ENCOUNTER — Encounter: Payer: Self-pay | Admitting: Internal Medicine

## 2017-09-30 VITALS — BP 128/80 | HR 83 | Temp 98.7°F | Resp 14 | Ht 72.0 in | Wt 229.5 lb

## 2017-09-30 DIAGNOSIS — A029 Salmonella infection, unspecified: Secondary | ICD-10-CM

## 2017-09-30 DIAGNOSIS — A020 Salmonella enteritis: Secondary | ICD-10-CM

## 2017-09-30 LAB — COMPREHENSIVE METABOLIC PANEL
ALT: 38 U/L (ref 0–53)
AST: 17 U/L (ref 0–37)
Albumin: 4.3 g/dL (ref 3.5–5.2)
Alkaline Phosphatase: 55 U/L (ref 39–117)
BUN: 17 mg/dL (ref 6–23)
CO2: 31 meq/L (ref 19–32)
Calcium: 9.3 mg/dL (ref 8.4–10.5)
Chloride: 100 mEq/L (ref 96–112)
Creatinine, Ser: 1.27 mg/dL (ref 0.40–1.50)
GFR: 64.57 mL/min (ref >60.00)
GLUCOSE: 90 mg/dL (ref 70–99)
Potassium: 4.4 mEq/L (ref 3.5–5.1)
Sodium: 137 mEq/L (ref 135–145)
Total Bilirubin: 0.9 mg/dL (ref 0.2–1.2)
Total Protein: 7.6 g/dL (ref 6.0–8.3)

## 2017-09-30 LAB — CBC WITH DIFFERENTIAL/PLATELET
BASOS PCT: 0.3 % (ref 0.0–3.0)
Basophils Absolute: 0 10*3/uL (ref 0.0–0.1)
EOS PCT: 0.8 % (ref 0.0–5.0)
Eosinophils Absolute: 0.1 10*3/uL (ref 0.0–0.7)
HCT: 45.4 % (ref 39.0–52.0)
Hemoglobin: 14.9 g/dL (ref 13.0–17.0)
Lymphocytes Relative: 10.8 % — ABNORMAL LOW (ref 12.0–46.0)
Lymphs Abs: 1.1 10*3/uL (ref 0.7–4.0)
MCHC: 32.9 g/dL (ref 30.0–36.0)
MCV: 85.3 fl (ref 78.0–100.0)
MONOS PCT: 6.9 % (ref 3.0–12.0)
Monocytes Absolute: 0.7 10*3/uL (ref 0.1–1.0)
NEUTROS ABS: 8.6 10*3/uL — AB (ref 1.4–7.7)
NEUTROS PCT: 81.2 % — AB (ref 43.0–77.0)
PLATELETS: 327 10*3/uL (ref 150.0–400.0)
RBC: 5.32 Mil/uL (ref 4.22–5.81)
RDW: 13.7 % (ref 11.5–15.5)
WBC: 10.6 10*3/uL — ABNORMAL HIGH (ref 4.0–10.5)

## 2017-09-30 LAB — SEDIMENTATION RATE: Sed Rate: 5 mm/hr (ref 0–15)

## 2017-09-30 NOTE — Progress Notes (Signed)
Subjective:    Patient ID: Robert [name], male    DOB: 08/20/1971, 46 y.o.   MRN: [id number]  DOS:  09/30/2017 Type of visit - description : acute Interval history: was recently visiting [place], provides the following history. [facility], got sick November 17 with watery diarrhea, multiple episodes daily, no blood but mucus was present. By November 20, he felt quite dehydrated and went to a local hospital in [place]. He did have fever initially. Had no nausea, vomiting but + lower abdominal cramps with BMs. At the hospital he was diagnosed with Salmonella through blood test and Entamoeba histolytica w/ stool test.  had IV fluids, antibiotics and sent home on Cipro and apparently an antiparasitic medication. When he came back to the [place] November 22, was seen at the clinic on the 23rd, and recommended to continue antibiotics.  He finished antibiotics and is here because he continue with diarrhea: He had 6 BMs in the last 12 hours, stools are again watery mucousy but nonbloody.   Review of Systems No further fevers No nausea or vomiting   Past Medical History:  Diagnosis Date  . Asthma dx aprox 2002   exercise induced , rarely has sx   . Seasonal allergies     Past Surgical History:  Procedure Laterality Date  . VASECTOMY  2013    Social History   Socioeconomic History  . Marital status: Married    Spouse name: Not on file  . Number of children: 2  . Years of education: Not on file  . Highest education level: Not on file  Social Needs  . Financial resource strain: Not on file  . Food insecurity - worry: Not on file  . Food insecurity - inability: Not on file  . Transportation needs - medical: Not on file  . Transportation needs - non-medical: Not on file  Occupational History  . Occupation: attorney (immigration)  Tobacco Use  . Smoking status: Never Smoker  . Smokeless tobacco: Never Used  Substance and Sexual Activity  . Alcohol use: Yes   Comment: rarely   . Drug use: No  . Sexual activity: Yes  Other Topics Concern  . Not on file  Social History Narrative  . Not on file      Allergies as of 09/30/2017   No Known Allergies     Medication List        Accurate as of 09/30/17 10:50 AM. Always use your most recent med list.          ciprofloxacin 500 MG tablet Commonly known as:  CIPRO Take 500 mg by mouth 2 (two) times daily.          Objective:   Physical Exam BP 128/80 (BP Location: Left Arm, Patient Position: Sitting, Cuff Size: Normal)   Pulse 83   Temp 98.7 F (37.1 C) (Oral)   Resp 14   Ht 6' (1.829 m)   Wt 229 lb 8 oz (104.1 kg)   SpO2 98%   BMI 31.13 kg/m   General:   Well developed, well nourished . NAD.  HEENT:  Normocephalic . Face symmetric, atraumatic Lungs:  CTA B Normal respiratory effort, no intercostal retractions, no accessory muscle use. Heart: RRR,  no murmur.  no pretibial edema bilaterally  Abdomen:  Not distended, soft, mild tenderness at the lower abdomen bilaterally without mass or rebound Skin: Not pale. Not jaundice Neurologic:  alert & oriented X3.  Speech normal, gait appropriate for age  and unassisted Psych--  Cognition and judgment appear intact.  Cooperative with normal attention span and concentration.  Behavior appropriate. No anxious or depressed appearing.     Assessment & Plan:   Assessment Asthma, dx 2012, exercise induced  Podagra 09-2016 Seasonal allergies  PLAN: Acute diarrhea with fever: As described above, recently dx w/ Salmonella and E. histolytica.  Status post Cipro and antiparasitic medication At this point he continue with diarrhea but otherwise he looks and feels well. We will get CBC, CMP, sed rate. Patient request stool studies ; I noted that even if it shows Salmonella he may not need more antibiotics.  We agreed to proceed with the stool studies. Suspect he will improve gradually, recommend good hydration, bland diet, call  if not gradually improving.

## 2017-09-30 NOTE — Progress Notes (Signed)
Pre visit review using our clinic review tool, if applicable. No additional management support is needed unless otherwise documented below in the visit note. 

## 2017-09-30 NOTE — Patient Instructions (Signed)
GO TO THE LAB : Get the blood work    Please send me the records from [place]  Probiotics daily such as align  [place] diet  Drink plenty of fluids  Call if not gradually back to normal in 2 weeks    Salmonella Gastroenteritis, Adult Salmonella gastroenteritis is an infection of the intestines that can cause nausea, vomiting, and other symptoms. The infection usually lasts from 2 to 7 days. What are the causes? This condition is caused by salmonella bacteria. These bacteria can spread through a person's stool. You can get this infection by:  Eating food or drinking liquids that have the bacteria on it.  Drinking polluted, standing water.  Coming into contact with an animal that is carrying the bacteria.  What increases the risk? This condition is more likely to develop in:  Elderly adults.  People with a weakened immune system.  People with poor personal or kitchen hygiene.  What are the signs or symptoms? Symptoms of this condition include:  Nausea.  Vomiting.  Abdominal pain or cramps.  Diarrhea, which may be bloody.  Fever.  A headache.  How is this diagnosed? This condition may be diagnosed based on your medical history, a physical exam, and a blood or stool test. How is this treated? Often, the only treatment needed is to drink plenty of fluids. Drinking fluids is important because this infection can make you dehydrated. If your condition is severe, you may be prescribed an antibiotic medicine to help shorten the illness. Most people recover completely, but some people may develop lasting problems such as arthritis, irritation of the eyes, or painful urination. Follow these instructions at home: Drinking  Drink enough fluid to keep your urine clear or pale yellow. This helps prevent dehydration.  Until your diarrhea, nausea, or vomiting is under control, drink only clear liquids. Clear liquids are liquids you can see through, such as water, broth, or  non-caffeinated tea. Avoid milk, fruit juice, alcohol, and very hot or very cold drinks.  If you are dehydrated, ask your health care provider for specific rehydration instructions. Signs of dehydration include: ? Thirst. ? Dry lips or mouth. ? Dry, warm skin. ? Dark urine. ? A headache. Eating and drinking  If you are not hungry, do not force yourself to eat.  If you are hungry, eat a well-balanced diet that includes: ? A variety of complex carbohydrates, such as rice, wheat, potatoes, and bread. ? Lean meats. ? Yogurt. ? Fruits. ? Vegetables.  Avoid high-fat foods. They are difficult to digest.  If you have diarrhea, do not prepare food. Medicines  Take over-the-counter and prescription medicines only as told by your health care provider.  If you were prescribed an antibiotic medicine, take it as told by your health care provider. Do not stop taking the antibiotic even if you start to feel better. Other Instructions   Wash your hands well. This helps keep the bacteria from spreading to others.  Keep track of changes in your weight. Losing a lot of weight can be a sign of a serious problem. Ask your health care provider how much weight loss should concern you.  Keep all follow-up visits as told by your health care provider. How is this prevented? To prevent future salmonella infections:  Handle meat, eggs, seafood, and poultry properly.  Always cook meat, eggs, seafood, and poultry thoroughly.  Wash your hands and counters thoroughly after handling or preparing meat, eggs, seafood, and poultry.  Wash your hands thoroughly  after handling animals.  Get help right away if:  You cannot keep fluids down.  You keep vomiting or having diarrhea.  You have abdominal pain that gets worse.  You have abdominal pain in one small area.  Your diarrhea has more blood or mucus than before.  You have a fever.  You have any symptoms of severe dehydration. These  include: ? Extreme thirst. ? Confusion. ? Inability to sweat. ? Fainting. ? Dizziness. ? Weight loss. This information is not intended to replace advice given to you by your health care provider. Make sure you discuss any questions you have with your health care provider. Document Released: 10/15/2000 Document Revised: 03/25/2016 Document Reviewed: 07/12/2015 Elsevier Interactive Patient Education  [employer]2018 Elsevier Inc.

## 2017-10-02 NOTE — Assessment & Plan Note (Signed)
Acute diarrhea with fever: As described above, recently dx w/ Salmonella and E. histolytica.  Status post Cipro and antiparasitic medication At this point he continue with diarrhea but otherwise he looks and feels well. We will get CBC, CMP, sed rate. Patient request stool studies ; I noted that even if it shows Salmonella he may not need more antibiotics.  We agreed to proceed with the stool studies. Suspect he will improve gradually, recommend good hydration, bland diet, call if not gradually improving.

## 2017-10-03 LAB — FECAL LACTOFERRIN, QUANT
Fecal Lactoferrin: POSITIVE — AB
MICRO NUMBER: 81348199
SPECIMEN QUALITY: ADEQUATE

## 2017-10-03 NOTE — Addendum Note (Signed)
Addended by: Verdie ShireBAYNES, ANGELA M on: 10/03/2017 01:31 PM   Modules accepted: Orders

## 2017-10-04 LAB — STOOL CULTURE
MICRO NUMBER: 81348081
MICRO NUMBER: 81348082
MICRO NUMBER:: 81348083
SHIGA RESULT: NOT DETECTED
SPECIMEN QUALITY: ADEQUATE
SPECIMEN QUALITY: ADEQUATE
SPECIMEN QUALITY:: ADEQUATE

## 2018-04-13 ENCOUNTER — Ambulatory Visit: Payer: 59 | Admitting: Internal Medicine

## 2018-05-09 ENCOUNTER — Ambulatory Visit (INDEPENDENT_AMBULATORY_CARE_PROVIDER_SITE_OTHER): Payer: 59 | Admitting: Family Medicine

## 2018-05-09 ENCOUNTER — Encounter: Payer: Self-pay | Admitting: Family Medicine

## 2018-05-09 VITALS — BP 126/74 | HR 71 | Temp 98.9°F | Ht 72.0 in | Wt 230.0 lb

## 2018-05-09 DIAGNOSIS — E785 Hyperlipidemia, unspecified: Secondary | ICD-10-CM | POA: Diagnosis not present

## 2018-05-09 DIAGNOSIS — G8929 Other chronic pain: Secondary | ICD-10-CM | POA: Diagnosis not present

## 2018-05-09 DIAGNOSIS — Z0001 Encounter for general adult medical examination with abnormal findings: Secondary | ICD-10-CM

## 2018-05-09 DIAGNOSIS — M5441 Lumbago with sciatica, right side: Secondary | ICD-10-CM | POA: Diagnosis not present

## 2018-05-09 DIAGNOSIS — H5789 Other specified disorders of eye and adnexa: Secondary | ICD-10-CM | POA: Diagnosis not present

## 2018-05-09 DIAGNOSIS — R739 Hyperglycemia, unspecified: Secondary | ICD-10-CM | POA: Diagnosis not present

## 2018-05-09 LAB — LIPID PANEL
Cholesterol: 198 mg/dL (ref 0–200)
HDL: 41.2 mg/dL (ref >39.00)
LDL Cholesterol: 125 mg/dL — ABNORMAL HIGH (ref 0–99)
NONHDL: 156.47
Total CHOL/HDL Ratio: 5
Triglycerides: 158 mg/dL — ABNORMAL HIGH (ref 0.0–149.0)
VLDL: 31.6 mg/dL (ref 0.0–40.0)

## 2018-05-09 LAB — HEMOGLOBIN A1C: Hgb A1c MFr Bld: 5.3 % (ref 4.6–6.5)

## 2018-05-09 LAB — COMPREHENSIVE METABOLIC PANEL
ALK PHOS: 53 U/L (ref 39–117)
ALT: 26 U/L (ref 0–53)
AST: 21 U/L (ref 0–37)
Albumin: 4.3 g/dL (ref 3.5–5.2)
BILIRUBIN TOTAL: 0.6 mg/dL (ref 0.2–1.2)
BUN: 22 mg/dL (ref 6–23)
CO2: 29 mEq/L (ref 19–32)
CREATININE: 1.2 mg/dL (ref 0.40–1.50)
Calcium: 9.3 mg/dL (ref 8.4–10.5)
Chloride: 104 mEq/L (ref 96–112)
GFR: 68.75 mL/min (ref >60.00)
Glucose, Bld: 107 mg/dL — ABNORMAL HIGH (ref 70–99)
POTASSIUM: 4.4 meq/L (ref 3.5–5.1)
SODIUM: 139 meq/L (ref 135–145)
TOTAL PROTEIN: 7.4 g/dL (ref 6.0–8.3)

## 2018-05-09 LAB — CBC
HEMATOCRIT: 45.6 % (ref 39.0–52.0)
Hemoglobin: 15.8 g/dL (ref 13.0–17.0)
MCHC: 34.6 g/dL (ref 30.0–36.0)
MCV: 83.5 fl (ref 78.0–100.0)
Platelets: 238 10*3/uL (ref 150.0–400.0)
RBC: 5.47 Mil/uL (ref 4.22–5.81)
RDW: 13.5 % (ref 11.5–15.5)
WBC: 6.9 10*3/uL (ref 4.0–10.5)

## 2018-05-09 NOTE — Assessment & Plan Note (Signed)
Stable.  No red flag signs or symptoms.  Check CBC and CMET.  Recommended over-the-counter anti-inflammatory such as ibuprofen or naproxen as needed.  Recommended continued home exercises.  Would consider referral to sports medicine orthopedics if becomes severely problematic.

## 2018-05-09 NOTE — Patient Instructions (Signed)
It was very nice to see you today!  Please go back to your eye doctor - I think your eye irritation is from your contacts. Please let me know if you would like a referral to an ophthalmologist.  Please continue exercises for your sciatica.  You can also use over-the-counter anti-inflammatories such as ibuprofen or naproxen as needed.  Otherwise keep up the good work!  I will see you back in 1 year for next physical, or sooner as needed  Take care, Dr Jerline Pain   Preventive Care 40-64 Years, Male Preventive care refers to lifestyle choices and visits with your health care provider that can promote health and wellness. What does preventive care include?  A yearly physical exam. This is also called an annual well check.  Dental exams once or twice a year.  Routine eye exams. Ask your health care provider how often you should have your eyes checked.  Personal lifestyle choices, including: ? Daily care of your teeth and gums. ? Regular physical activity. ? Eating a healthy diet. ? Avoiding tobacco and drug use. ? Limiting alcohol use. ? Practicing safe sex. ? Taking low-dose aspirin every day starting at age 62. What happens during an annual well check? The services and screenings done by your health care provider during your annual well check will depend on your age, overall health, lifestyle risk factors, and family history of disease. Counseling Your health care provider may ask you questions about your:  Alcohol use.  Tobacco use.  Drug use.  Emotional well-being.  Home and relationship well-being.  Sexual activity.  Eating habits.  Work and work Statistician.  Screening You may have the following tests or measurements:  Height, weight, and BMI.  Blood pressure.  Lipid and cholesterol levels. These may be checked every 5 years, or more frequently if you are over 44 years old.  Skin check.  Lung cancer screening. You may have this screening every year  starting at age 84 if you have a 30-pack-year history of smoking and currently smoke or have quit within the past 15 years.  Fecal occult blood test (FOBT) of the stool. You may have this test every year starting at age 29.  Flexible sigmoidoscopy or colonoscopy. You may have a sigmoidoscopy every 5 years or a colonoscopy every 10 years starting at age 51.  Prostate cancer screening. Recommendations will vary depending on your family history and other risks.  Hepatitis C blood test.  Hepatitis B blood test.  Sexually transmitted disease (STD) testing.  Diabetes screening. This is done by checking your blood sugar (glucose) after you have not eaten for a while (fasting). You may have this done every 1-3 years.  Discuss your test results, treatment options, and if necessary, the need for more tests with your health care provider. Vaccines Your health care provider may recommend certain vaccines, such as:  Influenza vaccine. This is recommended every year.  Tetanus, diphtheria, and acellular pertussis (Tdap, Td) vaccine. You may need a Td booster every 10 years.  Varicella vaccine. You may need this if you have not been vaccinated.  Zoster vaccine. You may need this after age 44.  Measles, mumps, and rubella (MMR) vaccine. You may need at least one dose of MMR if you were born in 1957 or later. You may also need a second dose.  Pneumococcal 13-valent conjugate (PCV13) vaccine. You may need this if you have certain conditions and have not been vaccinated.  Pneumococcal polysaccharide (PPSV23) vaccine. You may need one  or two doses if you smoke cigarettes or if you have certain conditions.  Meningococcal vaccine. You may need this if you have certain conditions.  Hepatitis A vaccine. You may need this if you have certain conditions or if you travel or work in places where you may be exposed to hepatitis A.  Hepatitis B vaccine. You may need this if you have certain conditions or if  you travel or work in places where you may be exposed to hepatitis B.  Haemophilus influenzae type b (Hib) vaccine. You may need this if you have certain risk factors.  Talk to your health care provider about which screenings and vaccines you need and how often you need them. This information is not intended to replace advice given to you by your health care provider. Make sure you discuss any questions you have with your health care provider. Document Released: 11/14/2015 Document Revised: 07/07/2016 Document Reviewed: 08/19/2015 Elsevier Interactive Patient Education  [employer].

## 2018-05-09 NOTE — Assessment & Plan Note (Signed)
Has had elevated LDL in the past.  Check lipid panel today.

## 2018-05-09 NOTE — Assessment & Plan Note (Signed)
Check A1c today with blood draw.

## 2018-05-09 NOTE — Progress Notes (Signed)
Subjective:  Robert [name] is a 47 y.o. male who presents today for his annual comprehensive physical exam and to transfer care.  HPI:  He has 1 acute complaint today:  1. Eye irritation. Started about a month ago. Went to optometrist and was diagnosed with pink eye. Was started on a "steroid" eye drop and symptoms gradually improved. Patient did not routinely wear contacts during this time. Yesterday, patient open up a new pack of contacts with a contact in his right eye.  He subsequently developed a recurrence of his symptoms in his right eye alone.  I is read.  No vision changes.  No discharge.  No wateriness.  He has 1 chronic problem summarized below:  1.  Chronic low back pain with sciatica.  Started about a year ago.  Has seen chiropractor with significant improvement in symptoms.  Occasionally has flareups, but did not significantly interfere with his daily functions.   Lifestyle Diet: No specific diets.  Exercise: Used to do strength training and jogging.   [facility]/09[facility]2018  Decreased Interest 0  Down, Depressed, Hopeless 0  PHQ - 2 Score 0   ROS: Per HPI, otherwise a complete review of systems was negative.   PMH:  The following were reviewed and entered/updated in epic: Past Medical History:  Diagnosis Date  . Asthma dx aprox 2002   exercise induced , rarely has sx   . Seasonal allergies    Patient Active Problem List   Diagnosis Date Noted  . Dyslipidemia 05/09/2018  . Hyperglycemia 05/09/2018  . Chronic right-sided low back pain with right-sided sciatica 05/09/2018   Past Surgical History:  Procedure Laterality Date  . VASECTOMY  2013    Family History  Problem Relation Age of Onset  . Arthritis Other   . Hyperlipidemia Other   . Heart disease Other        no early dz   . Hypertension Other   . Rheum arthritis Mother   . Dystonia Mother   . COPD Father   . Alcohol abuse Father   . Colon cancer Neg Hx   . Prostate cancer  Neg Hx     Medications- reviewed and updated No current outpatient medications on file.   No current facility-administered medications for this visit.    Allergies-reviewed and updated No Known Allergies  Social History   Socioeconomic History  . Marital status: Married    Spouse name: Not on file  . Number of children: 2  . Years of education: Not on file  . Highest education level: Not on file  Occupational History  . Occupation: attorney (immigration)  Social Needs  . Financial resource strain: Not on file  . Food insecurity:    Worry: Not on file    Inability: Not on file  . Transportation needs:    Medical: Not on file    Non-medical: Not on file  Tobacco Use  . Smoking status: Never Smoker  . Smokeless tobacco: Never Used  Substance and Sexual Activity  . Alcohol use: Yes    Comment: rarely   . Drug use: No  . Sexual activity: Yes  Lifestyle  . Physical activity:    Days per week: Not on file    Minutes per session: Not on file  . Stress: Not on file  Relationships  . Social connections:    Talks on phone: Not on file    Gets together: Not on file    Attends religious service:  Not on file    Active member of club or organization: Not on file    Attends meetings of clubs or organizations: Not on file    Relationship status: Not on file  Other Topics Concern  . Not on file  Social History Narrative  . Not on file   Objective:  Physical Exam: BP 126/74 (BP Location: Left Arm, Patient Position: Sitting, Cuff Size: Normal)   Pulse 71   Temp 98.9 F (37.2 C) (Oral)   Ht 6' (1.829 m)   Wt 230 lb (104.3 kg)   SpO2 98%   BMI 31.19 kg/m   Body mass index is 31.19 kg/m. Wt Readings from Last 3 Encounters:  05/09/18 230 lb (104.3 kg)  09/30/17 229 lb 8 oz (104.1 kg)  06/03/17 230 lb 3.2 oz (104.4 kg)   Gen: NAD, resting comfortably HEENT: TMs normal bilaterally. OP clear. No thyromegaly noted.  Right conjunctival erythema noted. CV: RRR with no  murmurs appreciated Pulm: NWOB, CTAB with no crackles, wheezes, or rhonchi GI: Normal bowel sounds present. Soft, Nontender, Nondistended. MSK: no edema, cyanosis, or clubbing noted Skin: warm, dry Neuro: CN2-12 grossly intact. Strength 5/5 in upper and lower extremities. Reflexes symmetric and intact bilaterally.  Psych: Normal affect and thought content  Assessment/Plan:  Hyperglycemia Check A1c today with blood draw.  Dyslipidemia Has had elevated LDL in the past.  Check lipid panel today.  Chronic right-sided low back pain with right-sided sciatica Stable.  No red flag signs or symptoms.  Check CBC and CMET.  Recommended over-the-counter anti-inflammatory such as ibuprofen or naproxen as needed.  Recommended continued home exercises.  Would consider referral to sports medicine orthopedics if becomes severely problematic.  Eye irritation Likely secondary to contact irritation from his contacts.  Unlikely to be allergic or viral conjunctivitis given unilateral distribution.  Unlikely to be bacterial conjunctivitis based on history and exam.  He will be following up with his optometrist to discuss switching contact brands.  If symptoms persist, would consider referral to ophthalmology.  Preventative Healthcare: Up-to-date on immunizations and screenings.  Patient Counseling(The following topics were reviewed and/or handout was given):  -Nutrition: Stressed importance of moderation in sodium/caffeine intake, saturated fat and cholesterol, caloric balance, sufficient intake of fresh fruits, vegetables, and fiber.  -Stressed the importance of regular exercise.   -Substance Abuse: Discussed cessation/primary prevention of tobacco, alcohol, or other drug use; driving or other dangerous activities under the influence; availability of treatment for abuse.   -Injury prevention: Discussed safety belts, safety helmets, smoke detector, smoking near bedding or upholstery.   -Sexuality: Discussed  sexually transmitted diseases, partner selection, use of condoms, avoidance of unintended pregnancy and contraceptive alternatives.   -Dental health: Discussed importance of regular tooth brushing, flossing, and dental visits.  -Health maintenance and immunizations reviewed. Please refer to Health maintenance section.  Return to care in 1 year for next preventative visit.   Katina Degree. Jimmey Ralph, MD 05/09/2018 9:19 AM

## 2019-02-08 ENCOUNTER — Ambulatory Visit: Payer: 59 | Admitting: Podiatry

## 2019-02-08 ENCOUNTER — Other Ambulatory Visit: Payer: Self-pay | Admitting: Podiatry

## 2019-02-08 ENCOUNTER — Encounter: Payer: Self-pay | Admitting: Podiatry

## 2019-02-08 ENCOUNTER — Other Ambulatory Visit: Payer: Self-pay

## 2019-02-08 ENCOUNTER — Ambulatory Visit (INDEPENDENT_AMBULATORY_CARE_PROVIDER_SITE_OTHER): Payer: 59

## 2019-02-08 VITALS — BP 96/62 | HR 77 | Temp 97.9°F | Resp 16

## 2019-02-08 DIAGNOSIS — M79671 Pain in right foot: Secondary | ICD-10-CM | POA: Diagnosis not present

## 2019-02-08 DIAGNOSIS — M7671 Peroneal tendinitis, right leg: Secondary | ICD-10-CM

## 2019-02-08 DIAGNOSIS — L309 Dermatitis, unspecified: Secondary | ICD-10-CM | POA: Diagnosis not present

## 2019-02-08 MED ORDER — MELOXICAM 15 MG PO TABS: 15.0000 mg | ORAL_TABLET | Freq: Every day | ORAL | 2 refills | Status: DC

## 2019-02-08 MED ORDER — TERBINAFINE HCL 250 MG PO TABS: 250.0000 mg | ORAL_TABLET | Freq: Every day | ORAL | 0 refills | Status: DC

## 2019-02-08 MED ORDER — TRIAMCINOLONE ACETONIDE 10 MG/ML IJ SUSP
10.0000 mg | Freq: Once | INTRAMUSCULAR | Status: AC
  Administered 2019-02-08: 10 mg

## 2019-02-08 NOTE — Progress Notes (Signed)
   Subjective:    Patient ID: Robert [name], male    DOB: [birth year], 48 y.o.   MRN: [id number]  HPI    Review of Systems  All other systems reviewed and are negative.      Objective:   Physical Exam        Assessment & Plan:

## 2019-02-08 NOTE — Progress Notes (Signed)
Subjective:   Patient ID: Robert [name], male   DOB: 48 y.o.   MRN: [id number]   HPI Patient presents with pain on the outside of the right foot and also very dry skin with at times itching and blisters.  States that this is been going on for a couple months and that he did run a half marathon in January.  He is also lost weight recently and would like to be more active and does not smoke   Review of Systems  All other systems reviewed and are negative.       Objective:  Physical Exam Vitals signs and nursing note reviewed.  Constitutional:      Appearance: He is well-developed.  Pulmonary:     Effort: Pulmonary effort is normal.  Musculoskeletal: Normal range of motion.  Skin:    General: Skin is warm.  Neurological:     Mental Status: He is alert.     Neurovascular status intact muscle strength found to be adequate range of motion within normal limits with no indication of peroneal damage in the tendon.  There is quite a bit of pain and inflammation at the insertion of the peroneal tendon base of fifth metatarsal and slightly plantar to this area.  I did not note any digital perfusion issues and patient is well oriented x3.  Patient is noted to have quite a bit of irritation of the plantar arch tissue left over right     Assessment:  Acute peroneal tendinitis right with inflammation fluid at the insertion along with probability for athlete's foot condition     Plan:  H&P x-rays reviewed and today I went ahead and did a careful injection at the base of fifth metatarsal 3 mg Kenalog 5 mg Xylocaine applied fascial brace to lift up the lateral side of the foot along with ice and supportive shoes and also I am going to place him on Lamisil 250 mg daily to help with his skin condition and I placed him on Mobic 15 mg daily.  I went over risk of medications and I did review his last blood work which indicated normal liver function studies.  Patient will be seen back in 2 weeks or  earlier if needed  X-rays indicated slight reactivity around base of fifth metatarsal but no indications of pathology

## 2019-02-19 ENCOUNTER — Encounter: Payer: Self-pay | Admitting: Podiatry

## 2019-02-19 ENCOUNTER — Telehealth: Payer: Self-pay | Admitting: Podiatry

## 2019-02-19 NOTE — Telephone Encounter (Signed)
Pt called and stated he has an appointment Thursday. Val did give him instructions to follow until then. He stated that he will take it easy until then. I offered him to come in to see Dr. Charlsie Merles on Wednesday.

## 2019-02-22 ENCOUNTER — Ambulatory Visit: Payer: 59 | Admitting: Podiatry

## 2019-02-22 ENCOUNTER — Encounter: Payer: Self-pay | Admitting: Podiatry

## 2019-02-22 ENCOUNTER — Ambulatory Visit (INDEPENDENT_AMBULATORY_CARE_PROVIDER_SITE_OTHER): Payer: 59

## 2019-02-22 ENCOUNTER — Other Ambulatory Visit: Payer: Self-pay

## 2019-02-22 VITALS — Temp 97.7°F

## 2019-02-22 DIAGNOSIS — S9031XA Contusion of right foot, initial encounter: Secondary | ICD-10-CM

## 2019-02-22 DIAGNOSIS — L309 Dermatitis, unspecified: Secondary | ICD-10-CM

## 2019-02-22 DIAGNOSIS — M7671 Peroneal tendinitis, right leg: Secondary | ICD-10-CM | POA: Diagnosis not present

## 2019-02-22 DIAGNOSIS — T148XXA Other injury of unspecified body region, initial encounter: Secondary | ICD-10-CM

## 2019-02-22 NOTE — Progress Notes (Signed)
Subjective:   Patient ID: Robert [name], male   DOB: 48 y.o.   MRN: [id number]   HPI Patient states the side of the right foot has been feeling quite a bit better but he ran after his dog several days ago and felt a snap in the bottom of his arch and heel and stated it was very tender with mild improvement since then   ROS      Objective:  Physical Exam  Neurovascular status intact with patient's peroneal tendon insertion doing much better right with there being quite a bit of discomfort in the right plantar fascia with what appears to be a diminishment of the tightness of the fascia itself.  Patient was noted to have good digital perfusion well oriented x3 and states his skin is not as itchy as it was previously     Assessment:  Improved peroneal tendinitis right probable tear of the plantar fascia right with possible bone injury and what appears to be mycotic skin infection improved with antifungal     Plan:  H&P and conditions reviewed x-ray reviewed.  Today I have not recommended treatment except for aggressive ice and supportive shoes and continue brace usage but it is possible we will have to put a boot on if it becomes increasingly sore.  Reappoint to recheck as needed and we discussed the possibility for [facility]s antifungal therapy in the future  X-rays indicate that there is no signs of a tear of the fascia and there is no indications of bone injury

## 2019-04-23 ENCOUNTER — Ambulatory Visit: Payer: Self-pay

## 2019-04-23 NOTE — Telephone Encounter (Signed)
Incoming call from Patient requesting an appointment .  Wants to be tested for covid -19 .  Reviewed protocol Pt.  Transferred to [facility].   To make appointment.  Provided care advice.    Reason for Disposition . [1] COVID-19 EXPOSURE (Close Contact) AND [2] within last 14 days BUT [3] NO symptoms  Answer Assessment - Initial Assessment Questions 1. CLOSE CONTACT: "Who is the person with the confirmed or suspected COVID-19 infection that you were exposed to?"     Some one in court house 2. PLACE of CONTACT: "Where were you when you were exposed to COVID-19?" (e.g., home, school, medical waiting room; which city?)     Court housePE of CONTACT: "How much contact was there?" (e.g., sitting next to, live in same house, work in same office, same building)     work 4. DURATION of CONTACT: "How long were you in contact with the COVID-19 patient?" (e.g., a few seconds, passed by person, a few minutes, live with the patient)     unknown 5. DATE of CONTACT: "When did you have contact with a COVID-19 patient?" (e.g., how many days ago)     Last week 6. TRAVEL: "Have you traveled out of the country recently?" If so, "When and where?"     * Also ask about out-of-state travel, since the CDC has identified some high-risk cities for community spread in the [place].     * Note: Travel becomes less relevant if there is widespread community transmission where the patient lives.     *No Answer* 7. COMMUNITY SPREAD: "Are there lots of cases of COVID-19 (community spread) where you live?" (See public health department website, if unsure)       unknow 8. SYMPTOMS: "Do you have any symptoms?" (e.g., fever, cough, breathing difficulty)     denies 9. PREGNANCY OR POSTPARTUM: "Is there any chance you are pregnant?" "When was your last menstrual period?" "Did you deliver in the last 2 weeks?"     na 10. HIGH RISK: "Do you have any heart or lung problems? Do you have a weak immune system?" (e.g., CHF, COPD, asthma,  HIV positive, chemotherapy, renal failure, diabetes mellitus, sickle cell anemia)       denies  Protocols used: CORONAVIRUS (COVID-19) EXPOSURE-A-AH

## 2019-04-24 ENCOUNTER — Ambulatory Visit (INDEPENDENT_AMBULATORY_CARE_PROVIDER_SITE_OTHER): Payer: 59 | Admitting: Family Medicine

## 2019-04-24 ENCOUNTER — Encounter: Payer: Self-pay | Admitting: Family Medicine

## 2019-04-24 ENCOUNTER — Telehealth: Payer: Self-pay | Admitting: *Deleted

## 2019-04-24 DIAGNOSIS — Z20822 Contact with and (suspected) exposure to covid-19: Secondary | ICD-10-CM

## 2019-04-24 DIAGNOSIS — Z20828 Contact with and (suspected) exposure to other viral communicable diseases: Secondary | ICD-10-CM | POA: Diagnosis not present

## 2019-04-24 DIAGNOSIS — J309 Allergic rhinitis, unspecified: Secondary | ICD-10-CM | POA: Diagnosis not present

## 2019-04-24 NOTE — Telephone Encounter (Signed)
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

## 2019-04-24 NOTE — Telephone Encounter (Signed)
Patient scheduled for covid testing 6/24 @ 8:30 am @ [facility]. Instructions given and order placed.

## 2019-04-24 NOTE — Progress Notes (Signed)
    Chief Complaint:  Robert [name] is a 47 y.o. male who presents today for a virtual office visit with a chief complaint of Covid exposure.   Assessment/Plan:  COVID-19 exposure We will send for testing given his close known contact without proper PPE.  Discussed reasons to return to care and warning signs.  Allergic rhinitis He can use over-the-counter antihistamines as needed.    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 He has been around this person within the past few days and they have not been wearing a mask.  No fevers or chills.  No shortness of breath.  He does have some sinus pressure and occasional rhinorrhea that he attributes to allergic rhinitis.  This is been going on for the past few months.  ROS: Per HPI  PMH: He reports that he has never smoked. He has never used smokeless tobacco. He reports current alcohol use. He reports that he does not use drugs.      Objective/Observations  Physical Exam: Gen: NAD, resting comfortably Pulm: Normal work of breathing Neuro: Grossly normal, moves all extremities Psych: Normal affect and thought content  Virtual Visit via Video   I connected with Robert [name] on 04/24/19 at  2:40 PM EDT by a video enabled telemedicine application and verified that I am speaking with the correct person using two identifiers. I discussed the limitations of evaluation and management by telemedicine and the availability of in person appointments. The patient expressed understanding and agreed to proceed.  Due to technical difficulties, encounter was completed via audio only.  Patient location: Home Provider location: Robert [name] participating in the virtual visit: Myself and Patient     Robert [name]. Robert Pain, MD 04/24/2019 2:55 PM

## 2019-04-25 ENCOUNTER — Other Ambulatory Visit: Payer: Self-pay

## 2019-04-25 DIAGNOSIS — Z20822 Contact with and (suspected) exposure to covid-19: Secondary | ICD-10-CM

## 2019-04-30 LAB — NOVEL CORONAVIRUS, NAA: SARS-CoV-2, NAA: NOT DETECTED

## 2019-05-28 ENCOUNTER — Ambulatory Visit: Payer: 59 | Admitting: Family Medicine

## 2019-06-12 ENCOUNTER — Ambulatory Visit (INDEPENDENT_AMBULATORY_CARE_PROVIDER_SITE_OTHER): Payer: 59 | Admitting: Family Medicine

## 2019-06-12 ENCOUNTER — Encounter: Payer: Self-pay | Admitting: Family Medicine

## 2019-06-12 ENCOUNTER — Other Ambulatory Visit: Payer: Self-pay

## 2019-06-12 VITALS — BP 122/76 | HR 62 | Temp 98.0°F | Ht 72.0 in | Wt 216.2 lb

## 2019-06-12 DIAGNOSIS — R739 Hyperglycemia, unspecified: Secondary | ICD-10-CM

## 2019-06-12 DIAGNOSIS — Z0001 Encounter for general adult medical examination with abnormal findings: Secondary | ICD-10-CM

## 2019-06-12 DIAGNOSIS — E663 Overweight: Secondary | ICD-10-CM

## 2019-06-12 DIAGNOSIS — Z6829 Body mass index (BMI) 29.0-29.9, adult: Secondary | ICD-10-CM

## 2019-06-12 DIAGNOSIS — E785 Hyperlipidemia, unspecified: Secondary | ICD-10-CM | POA: Diagnosis not present

## 2019-06-12 DIAGNOSIS — G4700 Insomnia, unspecified: Secondary | ICD-10-CM | POA: Insufficient documentation

## 2019-06-12 LAB — LIPID PANEL
Cholesterol: 181 mg/dL (ref 0–200)
HDL: 35.5 mg/dL — ABNORMAL LOW (ref >39.00)
LDL Cholesterol: 122 mg/dL — ABNORMAL HIGH (ref 0–99)
NonHDL: 145.89
Total CHOL/HDL Ratio: 5
Triglycerides: 119 mg/dL (ref 0.0–149.0)
VLDL: 23.8 mg/dL (ref 0.0–40.0)

## 2019-06-12 LAB — COMPREHENSIVE METABOLIC PANEL
ALT: 20 U/L (ref 0–53)
AST: 16 U/L (ref 0–37)
Albumin: 4.6 g/dL (ref 3.5–5.2)
Alkaline Phosphatase: 44 U/L (ref 39–117)
BUN: 20 mg/dL (ref 6–23)
CO2: 29 mEq/L (ref 19–32)
Calcium: 9.6 mg/dL (ref 8.4–10.5)
Chloride: 101 mEq/L (ref 96–112)
Creatinine, Ser: 1.1 mg/dL (ref 0.40–1.50)
GFR: 71.19 mL/min (ref >60.00)
Glucose, Bld: 95 mg/dL (ref 70–99)
Potassium: 4.3 mEq/L (ref 3.5–5.1)
Sodium: 137 mEq/L (ref 135–145)
Total Bilirubin: 0.7 mg/dL (ref 0.2–1.2)
Total Protein: 7 g/dL (ref 6.0–8.3)

## 2019-06-12 LAB — CBC
HCT: 44.3 % (ref 39.0–52.0)
Hemoglobin: 15.2 g/dL (ref 13.0–17.0)
MCHC: 34.2 g/dL (ref 30.0–36.0)
MCV: 84.4 fl (ref 78.0–100.0)
Platelets: 210 10*3/uL (ref 150.0–400.0)
RBC: 5.25 Mil/uL (ref 4.22–5.81)
RDW: 12.9 % (ref 11.5–15.5)
WBC: 5.8 10*3/uL (ref 4.0–10.5)

## 2019-06-12 LAB — HEMOGLOBIN A1C: Hgb A1c MFr Bld: 5.1 % (ref 4.6–6.5)

## 2019-06-12 LAB — TSH: TSH: 2.09 u[IU]/mL (ref 0.35–4.50)

## 2019-06-12 MED ORDER — HYDROXYZINE HCL 50 MG PO TABS: 25.0000 mg | ORAL_TABLET | Freq: Every evening | ORAL | 1 refills | Status: DC | PRN

## 2019-06-12 NOTE — Assessment & Plan Note (Signed)
Check A1c. 

## 2019-06-12 NOTE — Assessment & Plan Note (Signed)
Check CBC, CMET, TSH, and lipid panel. 

## 2019-06-12 NOTE — Patient Instructions (Signed)
It was very nice to see you today!  Keep up the good work. I will send in hydroxyzine.  We will check blood work today.   Please try to incorporate the following into your daily routine:  1. Sleep only long enough to feel rested and then get out of bed  2. Go to bed and get up at the same time every day  3. Do not try to force yourself to sleep. If you can't sleep, get out of bed and try again later.  4. Have coffee, tea, and other foods that have caffeine only in the morning  5. Avoid alcohol in the late afternoon, evening, and bedtime  6. Avoid smoking, especially in the evening  7. Keep your bedroom dark, cool, quiet, and free of reminders of work or other things that cause you stress  8. Solve problems you have before you go to bed  9. Exercise several days a week, but not right before bed  10. Avoid looking at phones or reading devices ("e-books") that give off light before bed. This can make it harder to fall asleep.  Come back in 1 year for your next physical or sooner if needed.   Take care, Dr Jerline Pain  Please try these tips to maintain a healthy lifestyle:   Eat at least 3 REAL meals and 1-2 snacks per day.  Aim for no more than 5 hours between eating.  If you eat breakfast, please do so within one hour of getting up.    Obtain twice as many fruits/vegetables as protein or carbohydrate foods for both lunch and dinner. (Half of each meal should be fruits/vegetables, one quarter protein, and one quarter starchy carbs)   Cut down on sweet beverages. This includes juice, soda, and sweet tea.    Exercise at least 150 minutes every week.    Preventive Care 41-54 Years Old, Male Preventive care refers to lifestyle choices and visits with your health care provider that can promote health and wellness. This includes:  A yearly physical exam. This is also called an annual well check.  Regular dental and eye exams.  Immunizations.  Screening for certain  conditions.  Healthy lifestyle choices, such as eating a healthy diet, getting regular exercise, not using drugs or products that contain nicotine and tobacco, and limiting alcohol use. What can I expect for my preventive care visit? Physical exam Your health care provider will check:  Height and weight. These may be used to calculate body mass index (BMI), which is a measurement that tells if you are at a healthy weight.  Heart rate and blood pressure.  Your skin for abnormal spots. Counseling Your health care provider may ask you questions about:  Alcohol, tobacco, and drug use.  Emotional well-being.  Home and relationship well-being.  Sexual activity.  Eating habits.  Work and work Statistician. What immunizations do I need?  Influenza (flu) vaccine  This is recommended every year. Tetanus, diphtheria, and pertussis (Tdap) vaccine  You may need a Td booster every 10 years. Varicella (chickenpox) vaccine  You may need this vaccine if you have not already been vaccinated. Zoster (shingles) vaccine  You may need this after age 72. Measles, mumps, and rubella (MMR) vaccine  You may need at least one dose of MMR if you were born in 1957 or later. You may also need a second dose. Pneumococcal conjugate (PCV13) vaccine  You may need this if you have certain conditions and were not previously vaccinated. Pneumococcal polysaccharide (  PPSV23) vaccine  You may need one or two doses if you smoke cigarettes or if you have certain conditions. Meningococcal conjugate (MenACWY) vaccine  You may need this if you have certain conditions. Hepatitis A vaccine  You may need this if you have certain conditions or if you travel or work in places where you may be exposed to hepatitis A. Hepatitis B vaccine  You may need this if you have certain conditions or if you travel or work in places where you may be exposed to hepatitis B. Haemophilus influenzae type b (Hib) vaccine  You  may need this if you have certain risk factors. Human papillomavirus (HPV) vaccine  If recommended by your health care provider, you may need three doses over 6 months. You may receive vaccines as individual doses or as more than one vaccine together in one shot (combination vaccines). Talk with your health care provider about the risks and benefits of combination vaccines. What tests do I need? Blood tests  Lipid and cholesterol levels. These may be checked every 5 years, or more frequently if you are over 80 years old.  Hepatitis C test.  Hepatitis B test. Screening  Lung cancer screening. You may have this screening every year starting at age 15 if you have a 30-pack-year history of smoking and currently smoke or have quit within the past 15 years.  Prostate cancer screening. Recommendations will vary depending on your family history and other risks.  Colorectal cancer screening. All adults should have this screening starting at age 45 and continuing until age 58. Your health care provider may recommend screening at age 97 if you are at increased risk. You will have tests every 1-10 years, depending on your results and the type of screening test.  Diabetes screening. This is done by checking your blood sugar (glucose) after you have not eaten for a while (fasting). You may have this done every 1-3 years.  Sexually transmitted disease (STD) testing. Follow these instructions at home: Eating and drinking  Eat a diet that includes fresh fruits and vegetables, whole grains, lean protein, and low-fat dairy products.  Take vitamin and mineral supplements as recommended by your health care provider.  Do not drink alcohol if your health care provider tells you not to drink.  If you drink alcohol: ? Limit how much you have to 0-2 drinks a day. ? Be aware of how much alcohol is in your drink. In the [place] one drink equals one 12 oz bottle of beer (355 mL), one 5 oz glass of wine (148 mL),  or one 1 oz glass of hard liquor (44 mL). Lifestyle  Take daily care of your teeth and gums.  Stay active. Exercise for at least 30 minutes on 5 or more days each week.  Do not use any products that contain nicotine or tobacco, such as cigarettes, e-cigarettes, and chewing tobacco. If you need help quitting, ask your health care provider.  If you are sexually active, practice safe sex. Use a condom or other form of protection to prevent STIs (sexually transmitted infections).  Talk with your health care provider about taking a low-dose aspirin every day starting at age 54. What's next?  Go to your health care provider once a year for a well check visit.  Ask your health care provider how often you should have your eyes and teeth checked.  Stay up to date on all vaccines. This information is not intended to replace advice given to you by your  health care provider. Make sure you discuss any questions you have with your health care provider. Document Released: 11/14/2015 Document Revised: 10/12/2018 Document Reviewed: 10/12/2018 Elsevier Patient Education  2020 [employer].

## 2019-06-12 NOTE — Assessment & Plan Note (Signed)
Discussed treatment options. Will start hydroxyzine. Discussed sleep hygiene. Check CBC, CMET, and TSH.

## 2019-06-12 NOTE — Progress Notes (Signed)
Please inform patient of the following:  Cholesterol is borderline. "good" cholesterol is a bit down from last year but "bad" cholesterol is also down a bit. Blood sugar is better than last year. The rest of his labs are all normal.  Would like for him to continue working on diet and exercise and we can recheck in a year.  Robert [name]. Jerline Pain, MD 06/12/2019 4:26 PM

## 2019-06-12 NOTE — Progress Notes (Signed)
Chief Complaint:  Robert [name] is a 48 y.o. male who presents today for his annual comprehensive physical exam.    Assessment/Plan:  Insomnia Discussed treatment options. Will start hydroxyzine. Discussed sleep hygiene. Check CBC, CMET, and TSH.   Hyperglycemia Check A1c.  Dyslipidemia Check CBC, CMET, TSH, and lipid panel.    Body mass index is 29.32 kg/m. / Overweight BMI Metric Follow Up - 06/12/19 0929      BMI Metric Follow Up-Please document annually   BMI Metric Follow Up  Education provided        Preventative Healthcare: Tdap given today. Due for flu vaccine later this year.   Patient Counseling(The following topics were reviewed and/or handout was given):  -Nutrition: Stressed importance of moderation in sodium/caffeine intake, saturated fat and cholesterol, caloric balance, sufficient intake of fresh fruits, vegetables, and fiber.  -Stressed the importance of regular exercise.   -Substance Abuse: Discussed cessation/primary prevention of tobacco, alcohol, or other drug use; driving or other dangerous activities under the influence; availability of treatment for abuse.   -Injury prevention: Discussed safety belts, safety helmets, smoke detector, smoking near bedding or upholstery.   -Sexuality: Discussed sexually transmitted diseases, partner selection, use of condoms, avoidance of unintended pregnancy and contraceptive alternatives.   -Dental health: Discussed importance of regular tooth brushing, flossing, and dental visits.  -Health maintenance and immunizations reviewed. Please refer to Health maintenance section.  Return to care in 1 year for next preventative visit.     Subjective:  HPI:  Robert [name] has no acute complaints today.   Robert [name] has had some issues with insomnia for the last several months.  Robert [name] has had this intermittently throughout most of his adult life.  Robert [name] usually occurs at time of increased stress.  Has been under increased stress due to COVID-19  pandemic.  Robert [name] falls asleep okay however has some difficulty staying asleep.  Will stay asleep for 3 to 4 hours and then wake up and have difficulty going back to sleep.  Has tried melatonin, CBD oil, and ZzzQuil with no significant improvement.  Lifestyle Diet: Eats low glycemic diet. Low carb and low fat.  Exercise: Likes to run.   Depression screen PHQ 2/9 06/12/2019  Decreased Interest 0  Down, Depressed, Hopeless 1  PHQ - 2 Score 1  Altered sleeping 2  Tired, decreased energy 1  Change in appetite 1  Feeling bad or failure about yourself  0  Trouble concentrating 1  Moving slowly or fidgety/restless 0  Suicidal thoughts 0  PHQ-9 Score 6   Health Maintenance Due  Topic Date Due  . INFLUENZA VACCINE  06/02/2019    ROS: Per HPI, otherwise a complete review of systems was negative.   PMH:  The following were reviewed and entered/updated in epic: Past Medical History:  Diagnosis Date  . Asthma dx aprox 2002   exercise induced , rarely has sx   . Seasonal allergies    Patient Active Problem List   Diagnosis Date Noted  . Insomnia 06/12/2019  . Dyslipidemia 05/09/2018  . Hyperglycemia 05/09/2018  . Chronic right-sided low back pain with right-sided sciatica 05/09/2018   Past Surgical History:  Procedure Laterality Date  . VASECTOMY  2013    Family History  Problem Relation Age of Onset  . Arthritis Other   . Hyperlipidemia Other   . Heart disease Other        no early dz   . Hypertension Other   . Rheum arthritis Mother   .  Dystonia Mother   . COPD Father   . Alcohol abuse Father   . Colon cancer Neg Hx   . Prostate cancer Neg Hx     Medications- reviewed and updated Current Outpatient Medications  Medication Sig Dispense Refill  . hydrOXYzine (ATARAX/VISTARIL) 50 MG tablet Take 0.5-2 tablets (25-100 mg total) by mouth at bedtime as needed (insomnia). 90 tablet 1   No current facility-administered medications for this visit.     Allergies-reviewed and  updated No Known Allergies  Social History   Socioeconomic History  . Marital status: Married    Spouse name: Not on file  . Number of children: 2  . Years of education: Not on file  . Highest education level: Not on file  Occupational History  . Occupation: attorney (immigration)  Social Needs  . Financial resource strain: Not on file  . Food insecurity    Worry: Not on file    Inability: Not on file  . Transportation needs    Medical: Not on file    Non-medical: Not on file  Tobacco Use  . Smoking status: Never Smoker  . Smokeless tobacco: Never Used  Substance and Sexual Activity  . Alcohol use: Yes    Comment: rarely   . Drug use: No  . Sexual activity: Yes  Lifestyle  . Physical activity    Days per week: Not on file    Minutes per session: Not on file  . Stress: Not on file  Relationships  . Social Herbalist on phone: Not on file    Gets together: Not on file    Attends religious service: Not on file    Active member of club or organization: Not on file    Attends meetings of clubs or organizations: Not on file    Relationship status: Not on file  Other Topics Concern  . Not on file  Social History Narrative  . Not on file        Objective:  Physical Exam: BP 122/76   Pulse 62   Temp 98 F (36.7 C)   Ht 6' (1.829 m)   Wt 216 lb 3.2 oz (98.1 kg)   SpO2 97%   BMI 29.32 kg/m   Body mass index is 29.32 kg/m. Wt Readings from Last 3 Encounters:  06/12/19 216 lb 3.2 oz (98.1 kg)  05/09/18 230 lb (104.3 kg)  09/30/17 229 lb 8 oz (104.1 kg)   Gen: NAD, resting comfortably HEENT: TMs normal bilaterally. OP clear. No thyromegaly noted.  CV: RRR with no murmurs appreciated Pulm: NWOB, CTAB with no crackles, wheezes, or rhonchi GI: Normal bowel sounds present. Soft, Nontender, Nondistended. MSK: no edema, cyanosis, or clubbing noted Skin: warm, dry Neuro: CN2-12 grossly intact. Strength 5/5 in upper and lower extremities. Reflexes  symmetric and intact bilaterally.  Psych: Normal affect and thought content     Kristle Wesch M. Jerline Pain, MD 06/12/2019 9:31 AM

## 2019-06-26 ENCOUNTER — Encounter: Payer: Self-pay | Admitting: Family Medicine

## 2019-10-16 ENCOUNTER — Encounter: Payer: Self-pay | Admitting: Family Medicine

## 2019-10-19 ENCOUNTER — Encounter: Payer: Self-pay | Admitting: Physician Assistant

## 2019-10-19 ENCOUNTER — Ambulatory Visit (INDEPENDENT_AMBULATORY_CARE_PROVIDER_SITE_OTHER): Payer: 59 | Admitting: Physician Assistant

## 2019-10-19 VITALS — Temp 98.1°F

## 2019-10-19 DIAGNOSIS — Z7189 Other specified counseling: Secondary | ICD-10-CM

## 2019-10-19 DIAGNOSIS — U071 COVID-19: Secondary | ICD-10-CM

## 2019-10-19 NOTE — Progress Notes (Signed)
Virtual Visit via Video   I connected with Robert [name] on 10/19/19 at 11:20 AM EST by a video enabled telemedicine application and verified that I am speaking with the correct person using two identifiers. Location patient: Home Location provider: [facility], Office Persons participating in the virtual visit: Jaquawn, Saffran PA-C  I discussed the limitations of evaluation and management by telemedicine and the availability of in person appointments. The patient expressed understanding and agreed to proceed.  Subjective:   HPI:   Patient is requesting evaluation for possible COVID-19.  Symptom onset: Friday 10/12/2019  Travel/contacts: Pt was exposed to co-worker found out on Sunday. Lab test #1 was negative. Rapid done Tuesday was positive and then Lab test done Wed was positive. Daughter tested positive Wed. Wife negative.  Patient endorses the following symptoms: sinus congestion below eyes and dry cough (non-productive) --> mild  Patient denies the following symptoms: Fever (none since weekend), sinus headache, ear pain, sore throat and shortness of breath; loss of taste or smell, no diarrhea/nausea  Treatments tried: antitussives or antihistamines, sudafed, Nyquil, simple saline spray, Ziacam, ibuprofen.   Overall symptoms are improving with time. Cough is mild.   Patient risk factors: Current COVID-19 risk of complications score: 1 Smoking status: BUFORD BREMER  reports that he has never smoked. He has never used smokeless tobacco.   ROS: See pertinent positives and negatives per HPI.  Patient Active Problem List   Diagnosis Date Noted  . Insomnia 06/12/2019  . Dyslipidemia 05/09/2018  . Hyperglycemia 05/09/2018  . Chronic right-sided low back pain with right-sided sciatica 05/09/2018    Social History   Tobacco Use  . Smoking status: Never Smoker  . Smokeless tobacco: Never Used  Substance Use Topics  . Alcohol use: Yes   Comment: rarely     Current Outpatient Medications:  .  hydrOXYzine (ATARAX/VISTARIL) 50 MG tablet, Take 0.5-2 tablets (25-100 mg total) by mouth at bedtime as needed (insomnia)., Disp: 90 tablet, Rfl: 1  No Known Allergies  Objective:   VITALS: Per patient if applicable, see vitals. GENERAL: Alert, appears well and in no acute distress. HEENT: Atraumatic, conjunctiva clear, no obvious abnormalities on inspection of external nose and ears. NECK: Normal movements of the head and neck. CARDIOPULMONARY: No increased WOB. Speaking in clear sentences. I:E ratio WNL.  MS: Moves all visible extremities without noticeable abnormality. PSYCH: Pleasant and cooperative, well-groomed. Speech normal rate and rhythm. Affect is appropriate. Insight and judgement are appropriate. Attention is focused, linear, and appropriate.  NEURO: CN grossly intact. Oriented as arrived to appointment on time with no prompting. Moves both UE equally.  SKIN: No obvious lesions, wounds, erythema, or cyanosis noted on face or hands.  Assessment and Plan:   Yon was seen today for positive for covid.  Diagnoses and all orders for this visit:  COVID-19  Advice given about COVID-19 virus infection    Patient has a respiratory illness without signs of acute distress or respiratory compromise at this time. This is likely a viral infection, which can come from a number of respiratory viruses.   Discussed importance of quarantining and current CDC guidelines of when to safely be around others, which is currently: --10 days since symptoms first appeared and --24 hours with no fever without the use of fever-reducing medications and --Other symptoms of COVID-19 are improving  Advised if they experience a "second sickening" or worsening symptoms as the illness progresses, they are to call the office for  further instructions or seek emergent evaluation for any severe symptoms.   . Reviewed expectations re: course of  current medical issues. . Discussed self-management of symptoms. . Outlined signs and symptoms indicating need for more acute intervention. . Patient verbalized understanding and all questions were answered. Marland Kitchen Health Maintenance issues including appropriate healthy diet, exercise, and smoking avoidance were discussed with patient. . See orders for this visit as documented in the electronic medical record.  I discussed the assessment and treatment plan with the patient. The patient was provided an opportunity to ask questions and all were answered. The patient agreed with the plan and demonstrated an understanding of the instructions.   The patient was advised to call back or seek an in-person evaluation if the symptoms worsen or if the condition fails to improve as anticipated.   CMA or LPN served as scribe during this visit. History, Physical, and Plan performed by medical provider. The above documentation has been reviewed and is accurate and complete.  [place], [place] 10/19/2019

## 2020-08-14 ENCOUNTER — Telehealth (INDEPENDENT_AMBULATORY_CARE_PROVIDER_SITE_OTHER): Payer: 59 | Admitting: Family Medicine

## 2020-08-14 ENCOUNTER — Encounter: Payer: Self-pay | Admitting: Family Medicine

## 2020-08-14 VITALS — Ht 72.0 in | Wt 220.0 lb

## 2020-08-14 DIAGNOSIS — R066 Hiccough: Secondary | ICD-10-CM | POA: Diagnosis not present

## 2020-08-14 MED ORDER — PANTOPRAZOLE SODIUM 40 MG PO TBEC: 40.0000 mg | DELAYED_RELEASE_TABLET | Freq: Two times a day (BID) | ORAL | 0 refills | Status: DC

## 2020-08-14 MED ORDER — GABAPENTIN 100 MG PO CAPS: 100.0000 mg | ORAL_CAPSULE | Freq: Three times a day (TID) | ORAL | 0 refills | Status: DC

## 2020-08-14 NOTE — Progress Notes (Signed)
   Robert [name] is a 49 y.o. male who presents today for a virtual office visit.  Assessment/Plan:  Hiccups Given the symptoms of been ongoing for 6 days we will start pharmacotherapy with Protonix 40 mg twice daily and gabapentin 100 mg 3 times daily.  Discussed potential side effects.  Also discussed other home maneuvers he may try such as Valsalva maneuver and initiating his diving reflex.  Discussed reasons to return to care.  If symptoms have not resolved over the next 2 to 3 days you will need in person office visit with labs.  He will check with me early next week to let me know how symptoms are progressing.    Subjective:  HPI:  Patient here with persistent hiccups for the past 6 days.  He has been having some regurgitation reflux symptoms.  He has been doing quite a bit of traveling and is not sure if he had anything to eat that trigger the symptoms.  He has tried several over-the-counter acid blocker medications no improvement.  Also tried all maneuvers which have not worked either.  Symptoms are so severe that they are limiting his ability to perform job functions.       Objective/Observations  Physical Exam: Gen: NAD, resting comfortably Pulm: Normal work of breathing Neuro: Grossly normal, moves all extremities Psych: Normal affect and thought content  Virtual Visit via Video   I connected with Robert [name] on 08/14/20 at 10:40 AM EDT by a video enabled telemedicine application and verified that I am speaking with the correct person using two identifiers. The limitations of evaluation and management by telemedicine and the availability of in person appointments were discussed. The patient expressed understanding and agreed to proceed.   Patient location: Home Provider location: [place] [employer] Persons participating in the virtual visit: Myself and Patient     Katina Degree. Jimmey Ralph, MD 08/14/2020 11:00 AM

## 2020-08-18 ENCOUNTER — Encounter: Payer: Self-pay | Admitting: Family Medicine

## 2020-08-18 ENCOUNTER — Other Ambulatory Visit: Payer: Self-pay

## 2020-08-18 DIAGNOSIS — R059 Cough, unspecified: Secondary | ICD-10-CM

## 2020-08-18 NOTE — Telephone Encounter (Signed)
Please advise 

## 2020-08-19 ENCOUNTER — Other Ambulatory Visit: Payer: Self-pay

## 2020-08-19 ENCOUNTER — Ambulatory Visit (INDEPENDENT_AMBULATORY_CARE_PROVIDER_SITE_OTHER)
Admission: RE | Admit: 2020-08-19 | Discharge: 2020-08-19 | Disposition: A | Discharge status: Home / Self Care | Payer: 59 | Source: Ambulatory Visit | Attending: Family Medicine | Admitting: Family Medicine

## 2020-08-19 DIAGNOSIS — R059 Cough, unspecified: Secondary | ICD-10-CM

## 2020-08-20 NOTE — Telephone Encounter (Signed)
Please advise 

## 2020-08-20 NOTE — Progress Notes (Signed)
Please inform patient of the following:  Chest xray is NORMAL. Recommend pulmonology referral for chronic cough to discuss next steps in evaluation.  Robert [name]. Jimmey Ralph, MD 08/20/2020 11:42 AM

## 2020-08-21 ENCOUNTER — Other Ambulatory Visit: Payer: Self-pay

## 2020-08-21 DIAGNOSIS — R059 Cough, unspecified: Secondary | ICD-10-CM

## 2020-09-02 ENCOUNTER — Ambulatory Visit: Payer: 59 | Admitting: Pulmonary Disease

## 2020-09-02 ENCOUNTER — Other Ambulatory Visit: Payer: Self-pay

## 2020-09-02 ENCOUNTER — Encounter: Payer: Self-pay | Admitting: Pulmonary Disease

## 2020-09-02 VITALS — BP 122/78 | HR 81 | Temp 97.8°F | Ht 72.0 in | Wt 238.0 lb

## 2020-09-02 DIAGNOSIS — R053 Chronic cough: Secondary | ICD-10-CM | POA: Diagnosis not present

## 2020-09-02 DIAGNOSIS — J4530 Mild persistent asthma, uncomplicated: Secondary | ICD-10-CM

## 2020-09-02 DIAGNOSIS — K219 Gastro-esophageal reflux disease without esophagitis: Secondary | ICD-10-CM | POA: Diagnosis not present

## 2020-09-02 MED ORDER — FLOVENT HFA 110 MCG/ACT IN AERO: 2.0000 | INHALATION_SPRAY | Freq: Two times a day (BID) | RESPIRATORY_TRACT | 12 refills | Status: DC

## 2020-09-02 MED ORDER — ALBUTEROL SULFATE HFA 108 (90 BASE) MCG/ACT IN AERS: 2.0000 | INHALATION_SPRAY | Freq: Four times a day (QID) | RESPIRATORY_TRACT | 6 refills | Status: AC | PRN

## 2020-09-02 MED ORDER — PANTOPRAZOLE SODIUM 40 MG PO TBEC
40.0000 mg | DELAYED_RELEASE_TABLET | Freq: Two times a day (BID) | ORAL | 0 refills | Status: DC
Start: 2020-09-02 — End: 2021-09-22

## 2020-09-02 NOTE — Progress Notes (Signed)
Synopsis: Referred by Jacquiline Doe, MD for cough  Subjective:   PATIENT ID: Robert [name] GENDER: male DOB: 10-Nov-1970, MRN: [id number]   HPI  Chief Complaint  Patient presents with  . Consult    pt has chronic cough for weeks. pt states unproductive , dry, and hiccups. pt states taking bendryl at night. pt states taking acid blocker   Cristobal Advani is a 49 year old male, never smoker who is referred to pulmonary clinic for evaluation of chronic cough.   He has had a chronic dry cough over the last decade intermittently and has increased symptoms this year. He is an Pensions consultant and has had issues with the cough interrupting his work day during meetings. The cough is dry and non-productive. He denies issues with sinus congestion or drainage. He was recently started on pantoprazole therapy by his primary care physician with some improvement in the cough. The cough is worse late morning through the afternoons. He denies disturbance of his sleep from the cough. The cough usually starts in August and will go through October. He reports he typically gets a cold around the fall every year that will persist with the dry cough. He reports seasonal allergies in the fall and spring. He denies chest tightness or wheezing with the cough. He denies shortness of breath. Cold air will trigger the cough.   He reports history of exercise induced asthma in his 30s. He is a never smoker. His father was a smoker who developed emphysema and had lung cancer.  He denies joint pains. He does develop skin rashes with various detergents and soaps.  Past Medical History:  Diagnosis Date  . Asthma dx aprox 2002   exercise induced , rarely has sx   . Seasonal allergies      Family History  Problem Relation Age of Onset  . Arthritis Other   . Hyperlipidemia Other   . Heart disease Other        no early dz   . Hypertension Other   . Rheum arthritis Mother   . Dystonia Mother   . COPD Father   . Alcohol  abuse Father   . Colon cancer Neg Hx   . Prostate cancer Neg Hx      Social History   Socioeconomic History  . Marital status: Married    Spouse name: Not on file  . Number of children: 2  . Years of education: Not on file  . Highest education level: Not on file  Occupational History  . Occupation: attorney (immigration)  Tobacco Use  . Smoking status: Never Smoker  . Smokeless tobacco: Never Used  Vaping Use  . Vaping Use: Never used  Substance and Sexual Activity  . Alcohol use: Yes    Comment: rarely   . Drug use: No  . Sexual activity: Yes  Other Topics Concern  . Not on file  Social History Narrative  . Not on file   Social Determinants of Health   Financial Resource Strain:   . Difficulty of Paying Living Expenses: Not on file  Food Insecurity:   . Worried About Programme researcher, broadcasting/film/video in the Last Year: Not on file  . Ran Out of Food in the Last Year: Not on file  Transportation Needs:   . Lack of Transportation (Medical): Not on file  . Lack of Transportation (Non-Medical): Not on file  Physical Activity:   . Days of Exercise per Week: Not on file  . Minutes of Exercise  per Session: Not on file  Stress:   . Feeling of Stress : Not on file  Social Connections:   . Frequency of Communication with Friends and Family: Not on file  . Frequency of Social Gatherings with Friends and Family: Not on file  . Attends Religious Services: Not on file  . Active Member of Clubs or Organizations: Not on file  . Attends Banker Meetings: Not on file  . Marital Status: Not on file  Intimate Partner Violence:   . Fear of Current or Ex-Partner: Not on file  . Emotionally Abused: Not on file  . Physically Abused: Not on file  . Sexually Abused: Not on file     No Known Allergies   Outpatient Medications Prior to Visit  Medication Sig Dispense Refill  . gabapentin (NEURONTIN) 100 MG capsule Take 1 capsule (100 mg total) by mouth 3 (three) times daily. 90  capsule 0  . hydrOXYzine (ATARAX/VISTARIL) 50 MG tablet Take 0.5-2 tablets (25-100 mg total) by mouth at bedtime as needed (insomnia). 90 tablet 1  . pantoprazole (PROTONIX) 40 MG tablet Take 1 tablet (40 mg total) by mouth 2 (two) times daily. 60 tablet 0   No facility-administered medications prior to visit.    Review of Systems  Constitutional: Negative for chills, diaphoresis, fever, malaise/fatigue and weight loss.  HENT: Negative for congestion, sinus pain and sore throat.   Eyes: Negative for blurred vision.  Respiratory: Positive for cough. Negative for hemoptysis, sputum production, shortness of breath and wheezing.   Cardiovascular: Negative for chest pain, palpitations, orthopnea, claudication, leg swelling and PND.  Gastrointestinal: Positive for heartburn. Negative for abdominal pain, blood in stool, constipation, diarrhea, melena, nausea and vomiting.  Genitourinary: Negative for dysuria and hematuria.  Musculoskeletal: Negative for joint pain and myalgias.  Skin: Negative for itching and rash.       Dry skin  Neurological: Negative for dizziness, weakness and headaches.  Endo/Heme/Allergies: Does not bruise/bleed easily.  Psychiatric/Behavioral: Negative.     Objective:   Vitals:   09/02/20 1337  BP: 122/78  Pulse: 81  Temp: 97.8 F (36.6 C)  TempSrc: Oral  SpO2: 98%  Weight: 238 lb (108 kg)  Height: 6' (1.829 m)     Physical Exam Constitutional:      General: He is not in acute distress.    Appearance: Normal appearance. He is not ill-appearing.  HENT:     Head: Normocephalic and atraumatic.     Nose: Nose normal.     Mouth/Throat:     Mouth: Mucous membranes are moist.     Pharynx: Oropharynx is clear.  Eyes:     General: No scleral icterus.    Conjunctiva/sclera: Conjunctivae normal.     Pupils: Pupils are equal, round, and reactive to light.  Cardiovascular:     Rate and Rhythm: Normal rate and regular rhythm.     Pulses: Normal pulses.      Heart sounds: Normal heart sounds. No murmur heard.   Pulmonary:     Effort: Pulmonary effort is normal. No respiratory distress.     Breath sounds: Normal breath sounds. No wheezing, rhonchi or rales.  Abdominal:     General: Bowel sounds are normal.     Palpations: Abdomen is soft.  Musculoskeletal:     Cervical back: Neck supple.     Right lower leg: No edema.     Left lower leg: No edema.  Lymphadenopathy:     Cervical: No cervical adenopathy.  Skin:    General: Skin is warm and dry.  Neurological:     General: No focal deficit present.     Mental Status: He is alert and oriented to person, place, and time. Mental status is at baseline.  Psychiatric:        Mood and Affect: Mood normal.        Behavior: Behavior normal.        Thought Content: Thought content normal.        Judgment: Judgment normal.     CBC    Component Value Date/Time   WBC 5.8 06/12/2019 0856   RBC 5.25 06/12/2019 0856   HGB 15.2 06/12/2019 0856   HCT 44.3 06/12/2019 0856   PLT 210.0 06/12/2019 0856   MCV 84.4 06/12/2019 0856   MCHC 34.2 06/12/2019 0856   RDW 12.9 06/12/2019 0856   LYMPHSABS 1.1 09/30/2017 1116   MONOABS 0.7 09/30/2017 1116   EOSABS 0.1 09/30/2017 1116   BASOSABS 0.0 09/30/2017 1116   BMP Latest Ref Rng & Units 06/12/2019 05/09/2018 09/30/2017  Glucose 70 - 99 mg/dL 95 245(Y) 90  BUN 6 - 23 mg/dL 20 22 17   Creatinine 0.40 - 1.50 mg/dL 0.99 8.33  Sodium 135 - 145 mEq/L 137 139 137  Potassium 3.5 - 5.1 mEq/L 4.3 4.4 4.4  Chloride 96 - 112 mEq/L 101 104 100  CO2 19 - 32 mEq/L 29 29 31   Calcium 8.4 - 10.5 mg/dL 9.6 9.3 9.3    Chest imaging: CXR 08/19/20 Heart and mediastinal contours are within normal limits. No focal opacities or effusions. No acute bony abnormality.  PFT: No PFT on file  Labs: Labs from last year reviewed No updated labs this year    Assessment & Plan:   Chronic cough - Plan: fluticasone (FLOVENT HFA) 110 MCG/ACT inhaler, albuterol (VENTOLIN  HFA) 108 (90 Base) MCG/ACT inhaler  Gastroesophageal reflux disease without esophagitis  Mild persistent reactive airway disease without complication - Plan: fluticasone (FLOVENT HFA) 110 MCG/ACT inhaler, albuterol (VENTOLIN HFA) 108 (90 Base) MCG/ACT inhaler  Discussion: Rafal Archuleta is a 49 year old male, never smoker who is referred to pulmonary clinic for evaluation of chronic cough.   He has chronic dry cough that is more prevalent in the fall season and is aggravated by cold air. The cough may be related to reactive airways disease or cough variant asthma. There does seem to be a component of reflux disease as he has noticed some improvement with PPI therapy.   He is to continue pantoprazole 40mg  daily for GERD. Instructed him to work on eating 2 hours prior to bed time. I have sent in prescription for flovent Bryson Corona 2 puffs twice daily with spacer along with albuterol inhaler 1-2 puffs every 4-6 hours as needed for diagnostic/therapeutic purposes for potential reactive airways disease/cough variant asthma. He may also benefit from 2nd generation antihistamine for fall and spring time allergies which we can consider in the future.   He is to call in 10-14 days to let [birth year]1991 know how his symptoms are doing.  Follow up in 3 months  [place], MD [facility] Office: [phone]       Current Outpatient Medications:  .  gabapentin (NEURONTIN) 100 MG capsule, Take 1 capsule (100 mg total) by mouth 3 (three) times daily., Disp: 90 capsule, Rfl: 0 .  hydrOXYzine (ATARAX/VISTARIL) 50 MG tablet, Take 0.5-2 tablets (25-100 mg total) by mouth at bedtime as needed (insomnia)., Disp: 90  tablet, Rfl: 1 .  pantoprazole (PROTONIX) 40 MG tablet, Take 1 tablet (40 mg total) by mouth 2 (two) times daily., Disp: 60 tablet, Rfl: 0 .  albuterol (VENTOLIN HFA) 108 (90 Base) MCG/ACT inhaler, Inhale 2 puffs into the lungs every 6 (six) hours as needed for wheezing or shortness  of breath., Disp: 8 g, Rfl: 6 .  fluticasone (FLOVENT HFA) 110 MCG/ACT inhaler, Inhale 2 puffs into the lungs in the morning and at bedtime., Disp: 1 each, Rfl: 12

## 2020-09-02 NOTE — Patient Instructions (Addendum)
Start flovent inhaler, 2 puffs twice daily with spacer (rinse mouth out with water after) Use albuterol inhaler 1-2 puffs as needed every 4-6 hours for cough Continue taking pantoprazole for reflux Update [place] in 10-14 days about how your symptoms are doing   Gastroesophageal Reflux Disease, Adult Gastroesophageal reflux (GER) happens when acid from the stomach flows up into the tube that connects the mouth and the stomach (esophagus). Normally, food travels down the esophagus and stays in the stomach to be digested. With GER, food and stomach acid sometimes move back up into the esophagus. You may have a disease called gastroesophageal reflux disease (GERD) if the reflux:  Happens often.  Causes frequent or very bad symptoms.  Causes problems such as damage to the esophagus. When this happens, the esophagus becomes sore and swollen (inflamed). Over time, GERD can make small holes (ulcers) in the lining of the esophagus. What are the causes? This condition is caused by a problem with the muscle between the esophagus and the stomach. When this muscle is weak or not normal, it does not close properly to keep food and acid from coming back up from the stomach. The muscle can be weak because of:  Tobacco use.  Pregnancy.  Having a certain type of hernia (hiatal hernia).  Alcohol use.  Certain foods and drinks, such as coffee, chocolate, onions, and peppermint. What increases the risk? You are more likely to develop this condition if you:  Are overweight.  Have a disease that affects your connective tissue.  Use NSAID medicines. What are the signs or symptoms? Symptoms of this condition include:  Heartburn.  Difficult or painful swallowing.  The feeling of having a lump in the throat.  A bitter taste in the mouth.  Bad breath.  Having a lot of saliva.  Having an upset or bloated stomach.  Belching.  Chest pain. Different conditions can cause chest pain. Make sure you see  your doctor if you have chest pain.  Shortness of breath or noisy breathing (wheezing).  Ongoing (chronic) cough or a cough at night.  Wearing away of the surface of teeth (tooth enamel).  Weight loss. How is this treated? Treatment will depend on how bad your symptoms are. Your doctor may suggest:  Changes to your diet.  Medicine.  Surgery. Follow these instructions at home: Eating and drinking   Follow a diet as told by your doctor. You may need to avoid foods and drinks such as: ? Coffee and tea (with or without caffeine). ? Drinks that contain alcohol. ? Energy drinks and sports drinks. ? Bubbly (carbonated) drinks or sodas. ? Chocolate and cocoa. ? Peppermint and mint flavorings. ? Garlic and onions. ? Horseradish. ? Spicy and acidic foods. These include peppers, chili powder, curry powder, vinegar, hot sauces, and BBQ sauce. ? Citrus fruit juices and citrus fruits, such as oranges, lemons, and limes. ? Tomato-based foods. These include red sauce, chili, salsa, and pizza with red sauce. ? Fried and fatty foods. These include donuts, french fries, potato chips, and high-fat dressings. ? High-fat meats. These include hot dogs, rib eye steak, sausage, ham, and bacon. ? High-fat dairy items, such as whole milk, butter, and cream cheese.  Eat small meals often. Avoid eating large meals.  Avoid drinking large amounts of liquid with your meals.  Avoid eating meals during the 2-3 hours before bedtime.  Avoid lying down right after you eat.  Do not exercise right after you eat. Lifestyle   Do not use  any products that contain nicotine or tobacco. These include cigarettes, e-cigarettes, and chewing tobacco. If you need help quitting, ask your doctor.  Try to lower your stress. If you need help doing this, ask your doctor.  If you are overweight, lose an amount of weight that is healthy for you. Ask your doctor about a safe weight loss goal. General instructions  Pay  attention to any changes in your symptoms.  Take over-the-counter and prescription medicines only as told by your doctor. Do not take aspirin, ibuprofen, or other NSAIDs unless your doctor says it is okay.  Wear loose clothes. Do not wear anything tight around your waist.  Raise (elevate) the head of your bed about 6 inches (15 cm).  Avoid bending over if this makes your symptoms worse.  Keep all follow-up visits as told by your doctor. This is important. Contact a doctor if:  You have new symptoms.  You lose weight and you do not know why.  You have trouble swallowing or it hurts to swallow.  You have wheezing or a cough that keeps happening.  Your symptoms do not get better with treatment.  You have a hoarse voice. Get help right away if:  You have pain in your arms, neck, jaw, teeth, or back.  You feel sweaty, dizzy, or light-headed.  You have chest pain or shortness of breath.  You throw up (vomit) and your throw-up looks like blood or coffee grounds.  You pass out (faint).  Your poop (stool) is bloody or black.  You cannot swallow, drink, or eat. Summary  If a person has gastroesophageal reflux disease (GERD), food and stomach acid move back up into the esophagus and cause symptoms or problems such as damage to the esophagus.  Treatment will depend on how bad your symptoms are.  Follow a diet as told by your doctor.  Take all medicines only as told by your doctor. This information is not intended to replace advice given to you by your health care provider. Make sure you discuss any questions you have with your health care provider. Document Revised: 04/26/2018 Document Reviewed: 04/26/2018 Elsevier Patient Education  2020 [employer].

## 2020-10-02 NOTE — Telephone Encounter (Signed)
Dr Francine Graven, please advise on pt email, thanks!  Dr. Francine Graven,  You asked me to reach out with an update.  I'm on my last day of the steroid inhaler and it has really worked. I have 4 pumps left.   I do not have a cough and even my snoring stopped (per my wife).  I'm sleeping better and have more energy during the day.   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   So, prior to our early 2022 follow up, what steps should I take beyond the acid blocker (that I've only been using occasionally)?  Refill the steroid inhaler?  Again, thanks,  Robert [name] [phone]

## 2020-10-09 NOTE — Telephone Encounter (Signed)
Dr. Francine Graven, please see mychart message sent by pt and advise:  To: [facility] POOL    From: Robert [name]    Created: 10/09/2020 11:19 AM     *-*-*This message was handled on 10/09/2020 12:14 PM by Kawan Valladolid P*-*-*  On 10/01/2020, my firm switched health insurance and my copay for the Flovent went from $40 [facility]) to $145 [facility])!  My insurance broker told me to contact you and see if there is a less expensive alternative that could replace the current inhaler in the future.   Alternatives listed on the [employer] website include: Asmanex HFA, Qvar Redihaler and Alvesco  Isn't "consumer driven healthcare" the best?

## 2020-10-10 MED ORDER — BECLOMETHASONE DIPROPIONATE 80 MCG/ACT IN AERS: INHALATION_SPRAY | RESPIRATORY_TRACT | 6 refills | Status: DC

## 2020-10-10 NOTE — Addendum Note (Signed)
Addended by: Wyvonne Lenz on: 10/10/2020 12:32 PM   Modules accepted: Orders

## 2020-10-26 DIAGNOSIS — Z03818 Encounter for observation for suspected exposure to other biological agents ruled out: Secondary | ICD-10-CM | POA: Diagnosis not present

## 2020-10-27 ENCOUNTER — Other Ambulatory Visit: Payer: Self-pay

## 2020-10-27 ENCOUNTER — Encounter: Payer: Self-pay | Admitting: Family Medicine

## 2020-10-27 DIAGNOSIS — Z20822 Contact with and (suspected) exposure to covid-19: Secondary | ICD-10-CM | POA: Diagnosis not present

## 2020-10-28 LAB — NOVEL CORONAVIRUS, NAA: SARS-CoV-2, NAA: DETECTED — AB

## 2020-10-28 LAB — SARS-COV-2, NAA 2 DAY TAT

## 2020-11-26 DIAGNOSIS — B351 Tinea unguium: Secondary | ICD-10-CM | POA: Diagnosis not present

## 2020-11-26 DIAGNOSIS — D2262 Melanocytic nevi of left upper limb, including shoulder: Secondary | ICD-10-CM | POA: Diagnosis not present

## 2020-11-26 DIAGNOSIS — D2261 Melanocytic nevi of right upper limb, including shoulder: Secondary | ICD-10-CM | POA: Diagnosis not present

## 2020-11-26 DIAGNOSIS — L821 Other seborrheic keratosis: Secondary | ICD-10-CM | POA: Diagnosis not present

## 2020-11-26 DIAGNOSIS — Z79899 Other long term (current) drug therapy: Secondary | ICD-10-CM | POA: Diagnosis not present

## 2020-11-26 DIAGNOSIS — D1801 Hemangioma of skin and subcutaneous tissue: Secondary | ICD-10-CM | POA: Diagnosis not present

## 2020-12-20 LAB — TSH: TSH: 2.21 (ref 0.41–5.90)

## 2020-12-20 LAB — TESTOSTERONE: Testosterone: 21.5

## 2020-12-24 DIAGNOSIS — B351 Tinea unguium: Secondary | ICD-10-CM | POA: Diagnosis not present

## 2020-12-24 DIAGNOSIS — Z79899 Other long term (current) drug therapy: Secondary | ICD-10-CM | POA: Diagnosis not present

## 2021-01-12 ENCOUNTER — Other Ambulatory Visit: Payer: Self-pay

## 2021-01-12 ENCOUNTER — Encounter: Payer: Self-pay | Admitting: Family Medicine

## 2021-01-12 ENCOUNTER — Ambulatory Visit (INDEPENDENT_AMBULATORY_CARE_PROVIDER_SITE_OTHER): Payer: BC Managed Care – PPO | Admitting: Family Medicine

## 2021-01-12 VITALS — BP 122/77 | HR 76 | Temp 98.1°F | Ht 72.0 in | Wt 236.0 lb

## 2021-01-12 DIAGNOSIS — R7989 Other specified abnormal findings of blood chemistry: Secondary | ICD-10-CM | POA: Diagnosis not present

## 2021-01-12 DIAGNOSIS — Z6832 Body mass index (BMI) 32.0-32.9, adult: Secondary | ICD-10-CM

## 2021-01-12 DIAGNOSIS — Z0001 Encounter for general adult medical examination with abnormal findings: Secondary | ICD-10-CM | POA: Diagnosis not present

## 2021-01-12 DIAGNOSIS — R053 Chronic cough: Secondary | ICD-10-CM

## 2021-01-12 DIAGNOSIS — G4700 Insomnia, unspecified: Secondary | ICD-10-CM

## 2021-01-12 DIAGNOSIS — E785 Hyperlipidemia, unspecified: Secondary | ICD-10-CM

## 2021-01-12 DIAGNOSIS — Z125 Encounter for screening for malignant neoplasm of prostate: Secondary | ICD-10-CM

## 2021-01-12 DIAGNOSIS — R739 Hyperglycemia, unspecified: Secondary | ICD-10-CM

## 2021-01-12 DIAGNOSIS — E669 Obesity, unspecified: Secondary | ICD-10-CM

## 2021-01-12 LAB — CBC
HCT: 46.1 % (ref 39.0–52.0)
Hemoglobin: 15.8 g/dL (ref 13.0–17.0)
MCHC: 34.3 g/dL (ref 30.0–36.0)
MCV: 83.8 fl (ref 78.0–100.0)
Platelets: 220 10*3/uL (ref 150.0–400.0)
RBC: 5.5 Mil/uL (ref 4.22–5.81)
RDW: 13.9 % (ref 11.5–15.5)
WBC: 5.5 10*3/uL (ref 4.0–10.5)

## 2021-01-12 LAB — COMPREHENSIVE METABOLIC PANEL
ALT: 24 U/L (ref 0–53)
AST: 18 U/L (ref 0–37)
Albumin: 4.3 g/dL (ref 3.5–5.2)
Alkaline Phosphatase: 48 U/L (ref 39–117)
BUN: 19 mg/dL (ref 6–23)
CO2: 29 mEq/L (ref 19–32)
Calcium: 9.4 mg/dL (ref 8.4–10.5)
Chloride: 101 mEq/L (ref 96–112)
Creatinine, Ser: 1.24 mg/dL (ref 0.40–1.50)
GFR: 67.83 mL/min (ref >60.00)
Glucose, Bld: 98 mg/dL (ref 70–99)
Potassium: 4.3 mEq/L (ref 3.5–5.1)
Sodium: 138 mEq/L (ref 135–145)
Total Bilirubin: 0.8 mg/dL (ref 0.2–1.2)
Total Protein: 7.2 g/dL (ref 6.0–8.3)

## 2021-01-12 LAB — LIPID PANEL
Cholesterol: 199 mg/dL (ref 0–200)
HDL: 39.3 mg/dL (ref >39.00)
LDL Cholesterol: 128 mg/dL — ABNORMAL HIGH (ref 0–99)
NonHDL: 159.94
Total CHOL/HDL Ratio: 5
Triglycerides: 162 mg/dL — ABNORMAL HIGH (ref 0.0–149.0)
VLDL: 32.4 mg/dL (ref 0.0–40.0)

## 2021-01-12 LAB — HEMOGLOBIN A1C: Hgb A1c MFr Bld: 5.3 % (ref 4.6–6.5)

## 2021-01-12 LAB — TESTOSTERONE: Testosterone: 422.25 ng/dL (ref 300.00–890.00)

## 2021-01-12 LAB — PSA: PSA: 1.59 ng/mL (ref 0.10–4.00)

## 2021-01-12 NOTE — Progress Notes (Signed)
Chief Complaint:  Robert [name] is a 50 y.o. male who presents today for his annual comprehensive physical exam.    Assessment/Plan:  New/Acute Problems: Low Testosterone Possibly could be interested in testosterone replacement.  Will check total testosterone this morning.  We will also check PSA.  Chronic Problems Addressed Today: Chronic cough Doing better on Qvar per pulmonology.  Insomnia Check labs today.  Continue hydroxyzine as needed.  Hyperglycemia Check A1c.  Dyslipidemia Check labs.  Discussed lifestyle modifications.  He is working on diet and exercise.   Body mass index is 32.01 kg/m. / Obese  BMI Metric Follow Up - 01/12/21 0908      BMI Metric Follow Up-Please document annually   BMI Metric Follow Up Education provided            Preventative Healthcare: He will be following up with GI for colonoscopy.  Check labs as above.  Up-to-date on vaccines.  Patient Counseling(The following topics were reviewed and/or handout was given):  -Nutrition: Stressed importance of moderation in sodium/caffeine intake, saturated fat and cholesterol, caloric balance, sufficient intake of fresh fruits, vegetables, and fiber.  -Stressed the importance of regular exercise.   -Substance Abuse: Discussed cessation/primary prevention of tobacco, alcohol, or other drug use; driving or other dangerous activities under the influence; availability of treatment for abuse.   -Injury prevention: Discussed safety belts, safety helmets, smoke detector, smoking near bedding or upholstery.   -Sexuality: Discussed sexually transmitted diseases, partner selection, use of condoms, avoidance of unintended pregnancy and contraceptive alternatives.   -Dental health: Discussed importance of regular tooth brushing, flossing, and dental visits.  -Health maintenance and immunizations reviewed. Please refer to Health maintenance section.  Return to care in 1 year for next preventative visit.      Subjective:  HPI:  He has no acute complaints today.   He has had some issues with weight management recently.  Has tried several diets without much success with losing weight.  He recently had blood drawn which showed low free testosterone levels.  He would like to have this rechecked.  He is otherwise doing well.  His cough seems to have improved with Qvar per pulmonology.  Lifestyle Diet: Working with [employer] app.  Exercise: Working with trainer a few times per week.   Depression screen PHQ 2/9 01/12/2021  Decreased Interest 0  Down, Depressed, Hopeless 0  PHQ - 2 Score 0  Altered sleeping -  Tired, decreased energy -  Change in appetite -  Feeling bad or failure about yourself  -  Trouble concentrating -  Moving slowly or fidgety/restless -  Suicidal thoughts -  PHQ-9 Score -    Health Maintenance Due  Topic Date Due  . Hepatitis C Screening  Never done  . COLONOSCOPY (Pts 45-69yrs Insurance coverage will need to be confirmed)  08/07/2015     ROS: Per HPI, otherwise a complete review of systems was negative.   PMH:  The following were reviewed and entered/updated in epic: Past Medical History:  Diagnosis Date  . Asthma dx aprox 2002   exercise induced , rarely has sx   . Seasonal allergies    Patient Active Problem List   Diagnosis Date Noted  . Chronic cough 01/12/2021  . Insomnia 06/12/2019  . Dyslipidemia 05/09/2018  . Hyperglycemia 05/09/2018  . Chronic right-sided low back pain with right-sided sciatica 05/09/2018   Past Surgical History:  Procedure Laterality Date  . VASECTOMY  2013    Family History  Problem  Relation Age of Onset  . Arthritis Other   . Hyperlipidemia Other   . Heart disease Other        no early dz   . Hypertension Other   . Rheum arthritis Mother   . Dystonia Mother   . COPD Father   . Alcohol abuse Father   . Colon cancer Neg Hx   . Prostate cancer Neg Hx     Medications- reviewed and updated Current Outpatient  Medications  Medication Sig Dispense Refill  . albuterol (VENTOLIN HFA) 108 (90 Base) MCG/ACT inhaler Inhale 2 puffs into the lungs every 6 (six) hours as needed for wheezing or shortness of breath. 8 g 6  . beclomethasone (QVAR) 80 MCG/ACT inhaler Inhale 1-2 puffs into lungs twice daily as needed. 8.7 g 6  . gabapentin (NEURONTIN) 100 MG capsule Take 1 capsule (100 mg total) by mouth 3 (three) times daily. 90 capsule 0  . hydrOXYzine (ATARAX/VISTARIL) 50 MG tablet Take 0.5-2 tablets (25-100 mg total) by mouth at bedtime as needed (insomnia). 90 tablet 1  . pantoprazole (PROTONIX) 40 MG tablet Take 1 tablet (40 mg total) by mouth 2 (two) times daily. 60 tablet 0  . terbinafine (LAMISIL) 250 MG tablet Take 250 mg by mouth daily.     No current facility-administered medications for this visit.    Allergies-reviewed and updated No Known Allergies  Social History   Socioeconomic History  . Marital status: Married    Spouse name: Not on file  . Number of children: 2  . Years of education: Not on file  . Highest education level: Not on file  Occupational History  . Occupation: attorney (immigration)  Tobacco Use  . Smoking status: Never Smoker  . Smokeless tobacco: Never Used  Vaping Use  . Vaping Use: Never used  Substance and Sexual Activity  . Alcohol use: Yes    Comment: rarely   . Drug use: No  . Sexual activity: Yes  Other Topics Concern  . Not on file  Social History Narrative  . Not on file   Social Determinants of Health   Financial Resource Strain: Not on file  Food Insecurity: Not on file  Transportation Needs: Not on file  Physical Activity: Not on file  Stress: Not on file  Social Connections: Not on file        Objective:  Physical Exam: BP 122/77   Pulse 76   Temp 98.1 F (36.7 C) (Temporal)   Ht 6' (1.829 m)   Wt 236 lb (107 kg)   SpO2 96%   BMI 32.01 kg/m   Body mass index is 32.01 kg/m. Wt Readings from Last 3 Encounters:  01/12/21 236 lb  (107 kg)  09/02/20 238 lb (108 kg)  08/14/20 220 lb (99.8 kg)  Gen: NAD, resting comfortably HEENT: TMs normal bilaterally. OP clear. No thyromegaly noted.  CV: RRR with no murmurs appreciated Pulm: NWOB, CTAB with no crackles, wheezes, or rhonchi GI: Normal bowel sounds present. Soft, Nontender, Nondistended. MSK: no edema, cyanosis, or clubbing noted Skin: warm, dry Neuro: CN2-12 grossly intact. Strength 5/5 in upper and lower extremities. Reflexes symmetric and intact bilaterally.  Psych: Normal affect and thought content     Robert [name] [name], MD 01/12/2021 9:09 AM

## 2021-01-12 NOTE — Assessment & Plan Note (Signed)
Check labs today.  Continue hydroxyzine as needed.

## 2021-01-12 NOTE — Assessment & Plan Note (Signed)
Doing better on Qvar per pulmonology.

## 2021-01-12 NOTE — Patient Instructions (Signed)
It was very nice to see you today!  We will check blood work today.  I will see back in year for your next annual physical.  Come back to see me sooner if needed.  Take care, Dr Jimmey Ralph  Please try these tips to maintain a healthy lifestyle:   Eat at least 3 REAL meals and 1-2 snacks per day.  Aim for no more than 5 hours between eating.  If you eat breakfast, please do so within one hour of getting up.    Each meal should contain half fruits/vegetables, one quarter protein, and one quarter carbs (no bigger than a computer mouse)   Cut down on sweet beverages. This includes juice, soda, and sweet tea.     Drink at least 1 glass of water with each meal and aim for at least 8 glasses per day   Exercise at least 150 minutes every week.    Preventive Care 42-50 Years Old, Male Preventive care refers to lifestyle choices and visits with your health care provider that can promote health and wellness. This includes:  A yearly physical exam. This is also called an annual wellness visit.  Regular dental and eye exams.  Immunizations.  Screening for certain conditions.  Healthy lifestyle choices, such as: ? Eating a healthy diet. ? Getting regular exercise. ? Not using drugs or products that contain nicotine and tobacco. ? Limiting alcohol use. What can I expect for my preventive care visit? Physical exam Your health care provider will check your:  Height and weight. These may be used to calculate your BMI (body mass index). BMI is a measurement that tells if you are at a healthy weight.  Heart rate and blood pressure.  Body temperature.  Skin for abnormal spots. Counseling Your health care provider may ask you questions about your:  Past medical problems.  Family's medical history.  Alcohol, tobacco, and drug use.  Emotional well-being.  Home life and relationship well-being.  Sexual activity.  Diet, exercise, and sleep habits.  Work and work  Astronomer.  Access to firearms. What immunizations do I need? Vaccines are usually given at various ages, according to a schedule. Your health care provider will recommend vaccines for you based on your age, medical history, and lifestyle or other factors, such as travel or where you work.   What tests do I need? Blood tests  Lipid and cholesterol levels. These may be checked every 5 years, or more often if you are over 73 years old.  Hepatitis C test.  Hepatitis B test. Screening  Lung cancer screening. You may have this screening every year starting at age 36 if you have a 30-pack-year history of smoking and currently smoke or have quit within the past 15 years.  Prostate cancer screening. Recommendations will vary depending on your family history and other risks.  Genital exam to check for testicular cancer or hernias.  Colorectal cancer screening. ? All adults should have this screening starting at age 71 and continuing until age 65. ? Your health care provider may recommend screening at age 39 if you are at increased risk. ? You will have tests every 1-10 years, depending on your results and the type of screening test.  Diabetes screening. ? This is done by checking your blood sugar (glucose) after you have not eaten for a while (fasting). ? You may have this done every 1-3 years.  STD (sexually transmitted disease) testing, if you are at risk. Follow these instructions at home: Eating  and drinking  Eat a diet that includes fresh fruits and vegetables, whole grains, lean protein, and low-fat dairy products.  Take vitamin and mineral supplements as recommended by your health care provider.  Do not drink alcohol if your health care provider tells you not to drink.  If you drink alcohol: ? Limit how much you have to 0-2 drinks a day. ? Be aware of how much alcohol is in your drink. In the [place] one drink equals one 12 oz bottle of beer (355 mL), one 5 oz glass of wine  (148 mL), or one 1 oz glass of hard liquor (44 mL).   Lifestyle  Take daily care of your teeth and gums. Brush your teeth every morning and night with fluoride toothpaste. Floss one time each day.  Stay active. Exercise for at least 30 minutes 5 or more days each week.  Do not use any products that contain nicotine or tobacco, such as cigarettes, e-cigarettes, and chewing tobacco. If you need help quitting, ask your health care provider.  Do not use drugs.  If you are sexually active, practice safe sex. Use a condom or other form of protection to prevent STIs (sexually transmitted infections).  If told by your health care provider, take low-dose aspirin daily starting at age 77.  Find healthy ways to cope with stress, such as: ? Meditation, yoga, or listening to music. ? Journaling. ? Talking to a trusted person. ? Spending time with friends and family. Safety  Always wear your seat belt while driving or riding in a vehicle.  Do not drive: ? If you have been drinking alcohol. Do not ride with someone who has been drinking. ? When you are tired or distracted. ? While texting.  Wear a helmet and other protective equipment during sports activities.  If you have firearms in your house, make sure you follow all gun safety procedures. What's next?  Go to your health care provider once a year for an annual wellness visit.  Ask your health care provider how often you should have your eyes and teeth checked.  Stay up to date on all vaccines. This information is not intended to replace advice given to you by your health care provider. Make sure you discuss any questions you have with your health care provider. Document Revised: 07/17/2019 Document Reviewed: 10/12/2018 Elsevier Patient Education  2021 [employer].

## 2021-01-12 NOTE — Telephone Encounter (Signed)
Printed and abstracted  

## 2021-01-12 NOTE — Assessment & Plan Note (Signed)
Check A1c. 

## 2021-01-12 NOTE — Assessment & Plan Note (Signed)
Check labs.  Discussed lifestyle modifications.  He is working on diet and exercise.

## 2021-01-13 NOTE — Telephone Encounter (Signed)
See note

## 2021-01-14 NOTE — Progress Notes (Signed)
Please inform patient of the following:  Labs are all stable. Testosterone level is NORMAL. Do not need to make any changes to his treatment plan at this time.  Katina Degree. Jimmey Ralph, MD 01/14/2021 9:08 AM

## 2021-07-31 ENCOUNTER — Telehealth: Payer: BC Managed Care – PPO | Admitting: Physician Assistant

## 2021-09-22 ENCOUNTER — Ambulatory Visit: Payer: BC Managed Care – PPO | Admitting: Pulmonary Disease

## 2021-09-22 ENCOUNTER — Other Ambulatory Visit: Payer: Self-pay

## 2021-09-22 ENCOUNTER — Encounter: Payer: Self-pay | Admitting: Pulmonary Disease

## 2021-09-22 VITALS — BP 116/76 | HR 86 | Temp 98.2°F | Ht 72.0 in | Wt 240.0 lb

## 2021-09-22 DIAGNOSIS — J4520 Mild intermittent asthma, uncomplicated: Secondary | ICD-10-CM

## 2021-09-22 MED ORDER — FLUTICASONE PROPIONATE HFA 110 MCG/ACT IN AERO: 2.0000 | INHALATION_SPRAY | Freq: Two times a day (BID) | RESPIRATORY_TRACT | 12 refills | Status: DC | PRN

## 2021-09-22 NOTE — Progress Notes (Signed)
Synopsis: Referred by Dimas Chyle, MD for cough  Subjective:   PATIENT ID: Robert [name] GENDER: male DOB: 07/02/1971, MRN: [id number]  HPI  Chief Complaint  Patient presents with   Follow-up    Patient needs refills.   Robert [name] is a 50 year old male, never smoker who returns to pulmonary clinic for asthma.   Patient has been doing well since last visit.  He experienced significant improvement in his dry cough with Flovent 110 MCG 2 puffs twice daily.  He has no other complaints at this time.  He does have increased symptoms with fall allergies and cold air.  He is planning to travel to [place] in the near future.  OV 09/02/20 He has had a chronic dry cough over the last decade intermittently and has increased symptoms this year. 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 The cough is dry and non-productive. He denies issues with sinus congestion or drainage. He was recently started on pantoprazole therapy by his primary care physician with some improvement in the cough. The cough is worse late morning through the afternoons. He denies disturbance of his sleep from the cough. The cough usually starts in August and will go through October. He reports he typically gets a cold around the fall every year that will persist with the dry cough. He reports seasonal allergies in the fall and spring. He denies chest tightness or wheezing with the cough. He denies shortness of breath. Cold air will trigger the cough.   He reports history of exercise induced asthma in his 37s. He is a never smoker. His father was a smoker who developed emphysema and had lung cancer.  He denies joint pains. He does develop skin rashes with various detergents and soaps.  Past Medical History:  Diagnosis Date   Asthma dx aprox 2002   exercise induced , rarely has sx    Seasonal allergies      Family History  Problem Relation Age of Onset   Arthritis  Other    Hyperlipidemia Other    Heart disease Other        no early dz    Hypertension Other    Rheum arthritis Mother    Dystonia Mother    COPD Father    Alcohol abuse Father    Colon cancer Neg Hx    Prostate cancer Neg Hx      Social History   Socioeconomic History   Marital status: Married    Spouse name: Not on file   Number of children: 2   Years of education: Not on file   Highest education level: Not on file  Occupational History   Occupation: attorney (immigration)  Tobacco Use   Smoking status: Never   Smokeless tobacco: Never  Vaping Use   Vaping Use: Never used  Substance and Sexual Activity   Alcohol use: Yes    Comment: rarely    Drug use: No   Sexual activity: Yes  Other Topics Concern   Not on file  Social History Narrative   Not on file   Social Determinants of Health   Financial Resource Strain: Not on file  Food Insecurity: Not on file  Transportation Needs: Not on file  Physical Activity: Not on file  Stress: Not on file  Social Connections: Not on file  Intimate Partner Violence: Not on file     No Known Allergies   Outpatient Medications Prior  to Visit  Medication Sig Dispense Refill   albuterol (VENTOLIN HFA) 108 (90 Base) MCG/ACT inhaler Inhale 2 puffs into the lungs every 6 (six) hours as needed for wheezing or shortness of breath. 8 g 6   beclomethasone (QVAR) 80 MCG/ACT inhaler Inhale 1-2 puffs into lungs twice daily as needed. 8.7 g 6   gabapentin (NEURONTIN) 100 MG capsule Take 1 capsule (100 mg total) by mouth 3 (three) times daily. 90 capsule 0   hydrOXYzine (ATARAX/VISTARIL) 50 MG tablet Take 0.5-2 tablets (25-100 mg total) by mouth at bedtime as needed (insomnia). 90 tablet 1   pantoprazole (PROTONIX) 40 MG tablet Take 1 tablet (40 mg total) by mouth 2 (two) times daily. 60 tablet 0   terbinafine (LAMISIL) 250 MG tablet Take 250 mg by mouth daily.     No facility-administered medications prior to visit.    Review of  Systems  Constitutional:  Negative for chills, diaphoresis, fever, malaise/fatigue and weight loss.  HENT:  Negative for congestion, sinus pain and sore throat.   Eyes:  Negative for blurred vision.  Respiratory:  Negative for cough, hemoptysis, sputum production, shortness of breath and wheezing.   Cardiovascular:  Negative for chest pain, palpitations, orthopnea, claudication, leg swelling and PND.  Gastrointestinal:  Negative for abdominal pain, blood in stool, constipation, diarrhea, heartburn, melena, nausea and vomiting.  Genitourinary:  Negative for dysuria and hematuria.  Musculoskeletal:  Negative for joint pain and myalgias.  Skin:  Negative for itching and rash.       Dry skin  Neurological:  Negative for dizziness, weakness and headaches.  Endo/Heme/Allergies:  Does not bruise/bleed easily.  Psychiatric/Behavioral: Negative.     Objective:   Vitals:   09/22/21 1523  BP: 116/76  Pulse: 86  Temp: 98.2 F (36.8 C)  TempSrc: Oral  SpO2: 98%  Weight: 240 lb (108.9 kg)  Height: 6' (1.829 m)     Physical Exam Constitutional:      General: He is not in acute distress.    Appearance: Normal appearance. He is not ill-appearing.  HENT:     Head: Normocephalic and atraumatic.  Eyes:     General: No scleral icterus.    Conjunctiva/sclera: Conjunctivae normal.  Cardiovascular:     Rate and Rhythm: Normal rate and regular rhythm.     Pulses: Normal pulses.     Heart sounds: Normal heart sounds. No murmur heard. Pulmonary:     Effort: Pulmonary effort is normal. No respiratory distress.     Breath sounds: Normal breath sounds. No wheezing, rhonchi or rales.  Musculoskeletal:     Cervical back: Neck supple.     Right lower leg: No edema.     Left lower leg: No edema.  Skin:    General: Skin is warm and dry.  Neurological:     General: No focal deficit present.     Mental Status: He is alert and oriented to person, place, and time. Mental status is at baseline.     CBC    Component Value Date/Time   WBC 5.5 01/12/2021 0902   RBC 5.50 01/12/2021 0902   HGB 15.8 01/12/2021 0902   HCT 46.1 01/12/2021 0902   PLT 220.0 01/12/2021 0902   MCV 83.8 01/12/2021 0902   MCHC 34.3 01/12/2021 0902   RDW 13.9 01/12/2021 0902   LYMPHSABS 1.1 09/30/2017 1116   MONOABS 0.7 09/30/2017 1116   EOSABS 0.1 09/30/2017 1116   BASOSABS 0.0 09/30/2017 1116   BMP Latest Ref Rng &  Units 01/12/2021 06/12/2019 05/09/2018  Glucose 70 - 99 mg/dL 98 95 511(M)  BUN 6 - 23 mg/dL 19 20 22   Creatinine 0.40 - 1.50 mg/dL 2.11 1.73  Sodium 135 - 145 mEq/L 138 137 139  Potassium 3.5 - 5.1 mEq/L 4.3 4.3 4.4  Chloride 96 - 112 mEq/L 101 101 104  CO2 19 - 32 mEq/L 29 29 29   Calcium 8.4 - 10.5 mg/dL 9.4 9.6 9.3   Chest imaging: CXR 08/19/20 Heart and mediastinal contours are within normal limits. No focal opacities or effusions. No acute bony abnormality.  PFT: No PFT on file  Labs:    Assessment & Plan:   Mild intermittent asthma without complication - Plan: fluticasone (FLOVENT HFA) 110 MCG/ACT inhaler  Discussion: Robert [name] is a 50 year old male, never smoker who returns to pulmonary clinic for asthma.   He is to continue on Flovent 110 MCG 2 puffs twice daily and as needed albuterol.  His asthma appears well controlled at this time.  We can consider pulmonary function testing in the future if he has an increase in symptoms.  Follow up in 1 year.  Bryson Corona, MD [facility] Office: [phone]       Current Outpatient Medications:    albuterol (VENTOLIN HFA) 108 (90 Base) MCG/ACT inhaler, Inhale 2 puffs into the lungs every 6 (six) hours as needed for wheezing or shortness of breath., Disp: 8 g, Rfl: 6   fluticasone (FLOVENT HFA) 110 MCG/ACT inhaler, Inhale 2 puffs into the lungs 2 (two) times daily as needed., Disp: 1 each, Rfl: 12

## 2021-09-22 NOTE — Patient Instructions (Addendum)
Continue flovent 2 puffs twice daily  Use albuterol 1-2 puffs every 4-6 hours as needed for cough, wheezing, chest tightness or shortness of breath  Please call us if your albuterol use is greater than 3-4 times per day consistently over multiple days

## 2021-10-09 ENCOUNTER — Encounter: Payer: Self-pay | Admitting: Pulmonary Disease

## 2021-11-02 ENCOUNTER — Encounter: Payer: Self-pay | Admitting: Pulmonary Disease

## 2021-11-02 DIAGNOSIS — J4530 Mild persistent asthma, uncomplicated: Secondary | ICD-10-CM

## 2021-11-02 DIAGNOSIS — R053 Chronic cough: Secondary | ICD-10-CM

## 2021-11-04 IMAGING — DX DG CHEST 2V
2 series · 2 of 2 positions shown · non-contrast
Comparison: None.

CLINICAL DATA: Cough

EXAM:
CHEST - 2 VIEW

[chest pa]
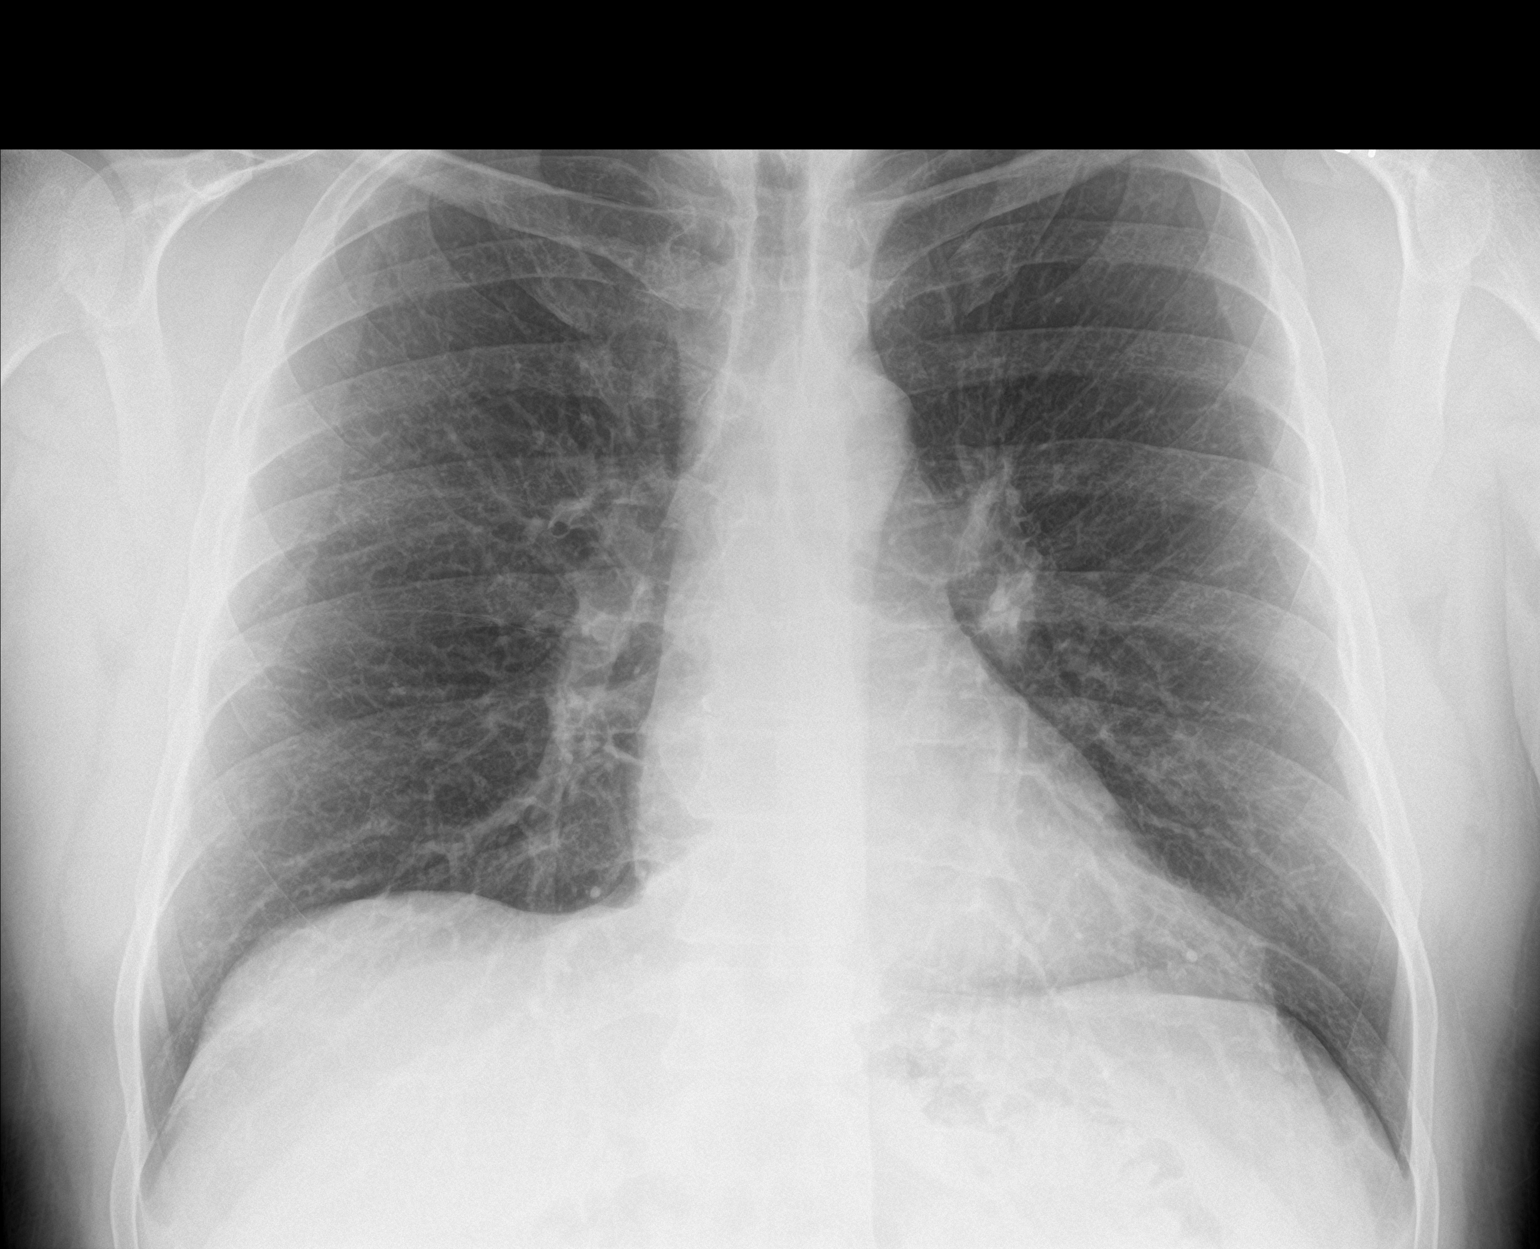

[chest lat]
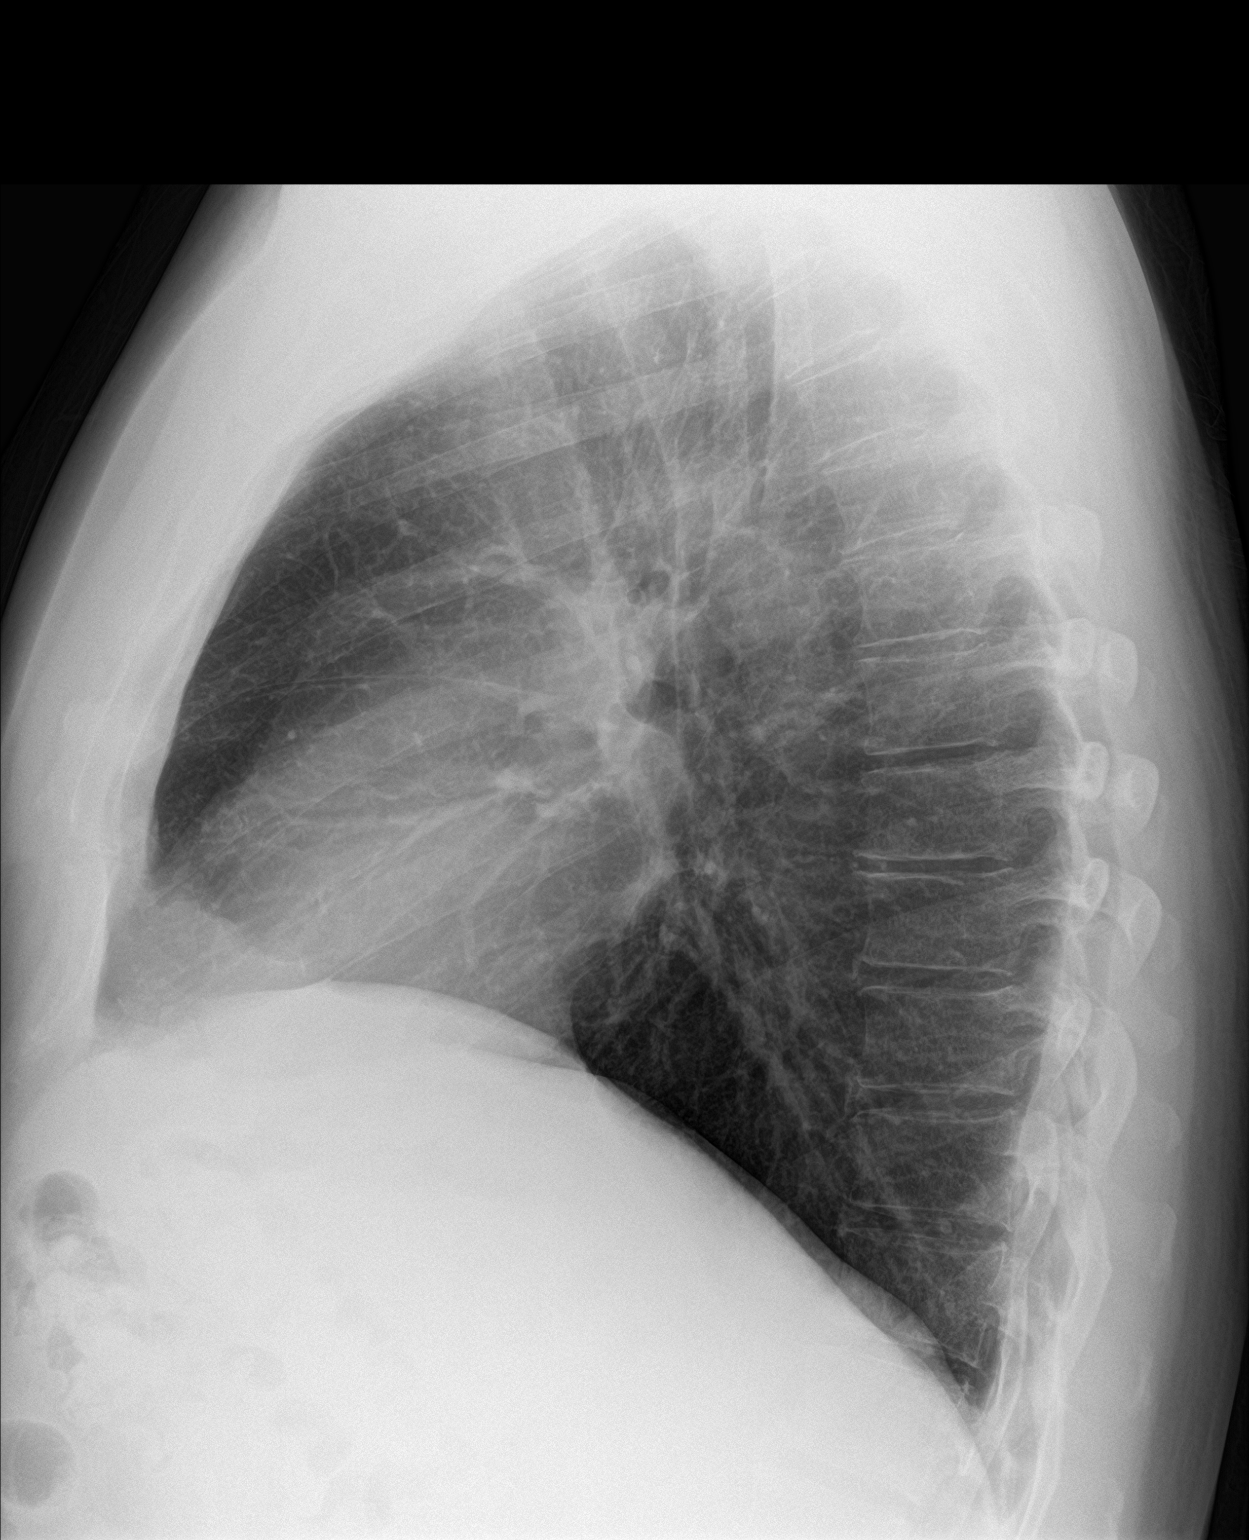

[2 of 2 positions shown; findings below may reference images not displayed]

FINDINGS: Heart and mediastinal contours are within normal limits. No focal
opacities or effusions. No acute bony abnormality.
IMPRESSION: Normal study.

## 2021-11-06 ENCOUNTER — Encounter: Payer: Self-pay | Admitting: Family Medicine

## 2021-11-06 ENCOUNTER — Telehealth (INDEPENDENT_AMBULATORY_CARE_PROVIDER_SITE_OTHER): Payer: BC Managed Care – PPO | Admitting: Family Medicine

## 2021-11-06 VITALS — Ht 72.0 in | Wt 233.4 lb

## 2021-11-06 DIAGNOSIS — J329 Chronic sinusitis, unspecified: Secondary | ICD-10-CM

## 2021-11-06 DIAGNOSIS — E669 Obesity, unspecified: Secondary | ICD-10-CM | POA: Diagnosis not present

## 2021-11-06 DIAGNOSIS — R739 Hyperglycemia, unspecified: Secondary | ICD-10-CM | POA: Diagnosis not present

## 2021-11-06 MED ORDER — OZEMPIC (0.25 OR 0.5 MG/DOSE) 2 MG/1.5ML ~~LOC~~ SOPN: 0.2500 mg | PEN_INJECTOR | SUBCUTANEOUS | 3 refills | Status: DC

## 2021-11-06 NOTE — Progress Notes (Signed)
° °  Robert [name] is a 51 y.o. male who presents today for a virtual office visit.  Assessment/Plan:  New/Acute Problems: Sinusitis Viral URI.  Symptoms are mild.  COVID test negative.  He has watchful waiting.  Discussed reasons to return to care.  Chronic Problems Addressed Today: Hyperglycemia He has been working on lifestyle modifications without significant improvement.  We discussed pharmacologic treatment options.  We will start Ozempic 0.25 mg weekly for 4 weeks and then increase to 0.5 mg weekly.  We discussed potential side effects.  He will follow-up with me in about a month via MyChart in person in about 2 months.  Obesity Has not had much success with lifestyle modifications.  We will be starting Ozempic for hypoglycemia which should also help with his weight management. He will follow-up with me in about a month via MyChart as below.     Subjective:  HPI:  See A/P for status of chronic conditions.  Patient would like to discuss assistance with weight loss.  He has been working with diet and exercise.  He has had trouble losing weight.  Works with a Psychologist, educational a few times per week.  He has tried phone apps work on his diet without significant improvement.  Has been dealing with URI. COVID test negative. Symptoms are manageable. Occasional headache. No fevers or chills. No facial pain or pressure.        Objective/Observations  Physical Exam: Gen: NAD, resting comfortably Pulm: Normal work of breathing Neuro: Grossly normal, moves all extremities Psych: Normal affect and thought content  Virtual Visit via Video   I connected with Morton Peters on 11/06/21 at 11:40 AM EST by a video enabled telemedicine application and verified that I am speaking with the correct person using two identifiers. The limitations of evaluation and management by telemedicine and the availability of in person appointments were discussed. The patient expressed understanding and agreed to  proceed.   Patient location: Home Provider location: [place] [employer] [facility] Persons participating in the virtual visit: Myself and Patient     Katina Degree. Jimmey Ralph, MD 11/06/2021 11:57 AM

## 2021-11-06 NOTE — Assessment & Plan Note (Signed)
Has not had much success with lifestyle modifications.  We will be starting Ozempic for hypoglycemia which should also help with his weight management. He will follow-up with me in about a month via MyChart as below.

## 2021-11-06 NOTE — Assessment & Plan Note (Signed)
He has been working on lifestyle modifications without significant improvement.  We discussed pharmacologic treatment options.  We will start Ozempic 0.25 mg weekly for 4 weeks and then increase to 0.5 mg weekly.  We discussed potential side effects.  He will follow-up with me in about a month via MyChart in person in about 2 months.

## 2021-11-10 ENCOUNTER — Other Ambulatory Visit: Payer: Self-pay | Admitting: *Deleted

## 2021-11-10 MED ORDER — OZEMPIC (0.25 OR 0.5 MG/DOSE) 2 MG/1.5ML ~~LOC~~ SOPN: 0.2500 mg | PEN_INJECTOR | SUBCUTANEOUS | 3 refills | Status: DC

## 2021-11-11 ENCOUNTER — Other Ambulatory Visit (HOSPITAL_COMMUNITY): Payer: Self-pay

## 2021-11-11 NOTE — Telephone Encounter (Signed)
Ran test claim return copay is $145

## 2021-11-11 NOTE — Telephone Encounter (Signed)
Received the following message from patient:   "Thank you! When Dr. Erin Fulling first prescribed it, [facility] quoted me $145. I contacted your office at the time and Dr. Erin Fulling prescribed a different inhaler. THEN [facility] gave me the original one for a $45 copay. And now has switched back. Its so confusing. But meanwhile, Ive run out so I do need something as soon as possible. Thanks again!"  PA Team, is there anyway you all can run a test claim on his Flovent 160mcg inhaler? Thank you so much!

## 2021-11-13 ENCOUNTER — Other Ambulatory Visit: Payer: Self-pay | Admitting: Family Medicine

## 2021-11-16 NOTE — Telephone Encounter (Signed)
Can we clarify what rx they will cover?

## 2021-11-16 NOTE — Telephone Encounter (Signed)
Pharmacy comment: Alternative Requested:REQUIRES STEP THERAPY.

## 2021-11-24 ENCOUNTER — Other Ambulatory Visit (HOSPITAL_COMMUNITY): Payer: Self-pay

## 2021-11-24 ENCOUNTER — Telehealth: Payer: Self-pay | Admitting: Pharmacy Technician

## 2021-11-24 NOTE — Telephone Encounter (Signed)
Please advise if there is a cheaper alternative. Thanks.

## 2021-11-24 NOTE — Telephone Encounter (Signed)
It looks like the pt had Quvar 10/10/20. Test claim shows a $45 copay for Quvar redihaler 1mcg/ACT. Are there any other Medications the Dr would prefer the pt to have?

## 2021-11-26 ENCOUNTER — Encounter: Payer: Self-pay | Admitting: Family Medicine

## 2021-11-26 NOTE — Telephone Encounter (Signed)
Is Qvar available anymore? What are other covered alteratives at a lower tier?

## 2021-11-30 ENCOUNTER — Other Ambulatory Visit: Payer: Self-pay | Admitting: Family Medicine

## 2021-11-30 ENCOUNTER — Other Ambulatory Visit: Payer: Self-pay | Admitting: *Deleted

## 2021-11-30 DIAGNOSIS — D2261 Melanocytic nevi of right upper limb, including shoulder: Secondary | ICD-10-CM | POA: Diagnosis not present

## 2021-11-30 DIAGNOSIS — L821 Other seborrheic keratosis: Secondary | ICD-10-CM | POA: Diagnosis not present

## 2021-11-30 DIAGNOSIS — D225 Melanocytic nevi of trunk: Secondary | ICD-10-CM | POA: Diagnosis not present

## 2021-11-30 DIAGNOSIS — D2262 Melanocytic nevi of left upper limb, including shoulder: Secondary | ICD-10-CM | POA: Diagnosis not present

## 2021-11-30 MED ORDER — TRULICITY 0.75 MG/0.5ML ~~LOC~~ SOAJ: 0.7500 mg | SUBCUTANEOUS | 4 refills | Status: DC

## 2021-11-30 NOTE — Telephone Encounter (Signed)
Did we get a prior auth for trulicity?  We can try sending in the 1.5mg  dose to see if they will approve that.   Katina Degree. Jimmey Ralph, MD 11/30/2021 3:17 PM

## 2021-11-30 NOTE — Telephone Encounter (Signed)
We can try trulicity as that is probably the most similar to ozempic.  Ok to send in rx for 0.75mg  once weekly for 4 weeks. Would like for him to check in with [place] in a few weeks to let us know how he is doing and we can increase the dose as tolerated.  Katina Degree. Jimmey Ralph, MD 11/30/2021 8:01 AM

## 2021-11-30 NOTE — Telephone Encounter (Signed)
Pt called in to state Trulicity .75 mg is not covered. He is not sure what to do. Please advise.

## 2021-12-01 ENCOUNTER — Other Ambulatory Visit: Payer: Self-pay | Admitting: *Deleted

## 2021-12-01 MED ORDER — TRULICITY 0.75 MG/0.5ML ~~LOC~~ SOAJ
0.7500 mg | SUBCUTANEOUS | 0 refills | Status: DC
Start: 2021-12-01 — End: 2021-12-09

## 2021-12-09 ENCOUNTER — Other Ambulatory Visit: Payer: Self-pay | Admitting: Family Medicine

## 2021-12-09 ENCOUNTER — Telehealth: Payer: Self-pay | Admitting: *Deleted

## 2021-12-09 MED ORDER — TRULICITY 1.5 MG/0.5ML ~~LOC~~ SOAJ
1.5000 mg | SUBCUTANEOUS | 0 refills | Status: DC
Start: 2021-12-09 — End: 2021-12-09

## 2021-12-09 NOTE — Telephone Encounter (Signed)
Key: [id number] Status Sent to [facility] Trulicity 0.75MG /0.5ML pen-injectors

## 2021-12-09 NOTE — Telephone Encounter (Signed)
Denied today Your request has been denied. Patient notified [employer] insurance will cover Trulicity 1.5mg  Rx send to pharmacy

## 2021-12-10 NOTE — Telephone Encounter (Signed)
Please send in trulicity 1.5mg  weekly.  Katina Degree. Jimmey Ralph, MD 12/10/2021 7:55 AM

## 2021-12-12 NOTE — Telephone Encounter (Signed)
LVM to patient stating Trullicity was send to pharmacy  If not cover by insurance let us know

## 2021-12-12 NOTE — Telephone Encounter (Signed)
Spoke with pharmacy and stated Trulicity 1.5 not cover by plan

## 2021-12-15 ENCOUNTER — Other Ambulatory Visit: Payer: Self-pay | Admitting: *Deleted

## 2021-12-15 MED ORDER — ACCU-CHEK SOFTCLIX LANCETS MISC: 1.0000 | Freq: Three times a day (TID) | 11 refills | Status: DC

## 2021-12-15 MED ORDER — ACCU-CHEK AVIVA PLUS W/DEVICE KIT: PACK | 0 refills | Status: DC

## 2021-12-15 MED ORDER — METFORMIN HCL 500 MG PO TABS: 500.0000 mg | ORAL_TABLET | Freq: Every day | ORAL | 0 refills | Status: DC

## 2021-12-15 MED ORDER — GLUCOSE BLOOD VI STRP: ORAL_STRIP | 12 refills | Status: DC

## 2021-12-15 NOTE — Telephone Encounter (Signed)
Yes we can try metformin xr 500mg  daily. Also ok to send in glucometer. Would like for him to follow up with me in a few weeks.  . Katina Degree, MD 12/15/2021 10:15 AM

## 2021-12-15 NOTE — Telephone Encounter (Signed)
LVM need to call insurance for coverage  Insurance probably will not cover since patient is not Diabetic

## 2021-12-15 NOTE — Telephone Encounter (Signed)
Please advise 

## 2021-12-25 ENCOUNTER — Other Ambulatory Visit: Payer: Self-pay | Admitting: *Deleted

## 2021-12-25 DIAGNOSIS — J4520 Mild intermittent asthma, uncomplicated: Secondary | ICD-10-CM

## 2021-12-25 MED ORDER — FLUTICASONE PROPIONATE HFA 110 MCG/ACT IN AERO: 2.0000 | INHALATION_SPRAY | Freq: Two times a day (BID) | RESPIRATORY_TRACT | 3 refills | Status: AC | PRN

## 2022-01-07 NOTE — Telephone Encounter (Signed)
Please advise 

## 2022-01-07 NOTE — Telephone Encounter (Signed)
We can continue the 500mg  daily for now. ? ? . Katina Degree, MD ?01/07/2022 3:47 PM  ? ?

## 2022-01-08 NOTE — Telephone Encounter (Signed)
Pt verified DOB and was advised of recommendations from Dr Jimmey Ralph. Pt verbalized understanding  ?

## 2022-03-13 ENCOUNTER — Other Ambulatory Visit: Payer: Self-pay | Admitting: Family Medicine

## 2022-05-27 ENCOUNTER — Encounter: Payer: Self-pay | Admitting: Family Medicine

## 2022-05-27 ENCOUNTER — Ambulatory Visit: Payer: BC Managed Care – PPO | Admitting: Family Medicine

## 2022-05-27 VITALS — BP 127/84 | HR 81 | Temp 98.1°F | Ht 72.0 in | Wt 239.4 lb

## 2022-05-27 DIAGNOSIS — Z1211 Encounter for screening for malignant neoplasm of colon: Secondary | ICD-10-CM | POA: Diagnosis not present

## 2022-05-27 DIAGNOSIS — M5440 Lumbago with sciatica, unspecified side: Secondary | ICD-10-CM

## 2022-05-27 DIAGNOSIS — E669 Obesity, unspecified: Secondary | ICD-10-CM | POA: Diagnosis not present

## 2022-05-27 DIAGNOSIS — R739 Hyperglycemia, unspecified: Secondary | ICD-10-CM | POA: Diagnosis not present

## 2022-05-27 DIAGNOSIS — G8929 Other chronic pain: Secondary | ICD-10-CM

## 2022-05-27 NOTE — Assessment & Plan Note (Signed)
Continue metformin 500 mg daily.  Insurance would not pay for [employer] or Trulicity.  We can recheck A1c when he comes back in for CPE.

## 2022-05-27 NOTE — Patient Instructions (Addendum)
It was very nice to see you today!  You need to get an MRI of your back to further assess.  I will refer to sports medicine for further management evaluation.  We will order your cologuard.  We will see you back when you are due for your next annual physical.  Please come back to see [place] sooner if needed.  Take care, Dr Jimmey Ralph  PLEASE NOTE:  If you had any lab tests please let us know if you have not heard back within a few days. You may see your results on mychart before we have a chance to review them but we will give you a call once they are reviewed by [place]. If we ordered any referrals today, please let us know if you have not heard from their office within the next week.   Please try these tips to maintain a healthy lifestyle:  Eat at least 3 REAL meals and 1-2 snacks per day.  Aim for no more than 5 hours between eating.  If you eat breakfast, please do so within one hour of getting up.   Each meal should contain half fruits/vegetables, one quarter protein, and one quarter carbs (no bigger than a computer mouse)  Cut down on sweet beverages. This includes juice, soda, and sweet tea.   Drink at least 1 glass of water with each meal and aim for at least 8 glasses per day  Exercise at least 150 minutes every week.

## 2022-05-27 NOTE — Assessment & Plan Note (Addendum)
No red flag signs or symptoms today.  He can continue using over-the-counter anti-inflammatories.  Given that his symptoms have progressively worsened the last couple months will likely need further imaging at this point.  May have some underlying degenerative changes but also concern for possible spinal stenosis given that symptoms seem to be worse after lying flat on his back.  He has had x-rays a few years ago which did not show any significant abnormalities. we will place referral to sports medicine for further evaluation management.  We discussed prescription NSAID such as diclofenac or meloxicam however he deferred.

## 2022-05-27 NOTE — Assessment & Plan Note (Signed)
BMI 32.  Insurance would not pay for [employer] or Trulicity.  He will continue metformin 500 g daily.

## 2022-05-27 NOTE — Progress Notes (Signed)
   Robert [name] is a 51 y.o. male who presents today for an office visit.  Assessment/Plan:  Chronic Problems Addressed Today: Chronic low back pain with sciatica No red flag signs or symptoms today.  He can continue using over-the-counter anti-inflammatories.  Given that his symptoms have progressively worsened the last couple months will likely need further imaging at this point.  May have some underlying degenerative changes but also concern for possible spinal stenosis given that symptoms seem to be worse after lying flat on his back.  He has had x-rays a few years ago which did not show any significant abnormalities. we will place referral to sports medicine for further evaluation management.  We discussed prescription NSAID such as diclofenac or meloxicam however he deferred.  Hyperglycemia Continue metformin 500 mg daily.  Insurance would not pay for [employer] or Trulicity.  We can recheck A1c when he comes back in for CPE.  Obesity BMI 32.  Insurance would not pay for [employer] or Trulicity.  He will continue metformin 500 g daily.     Subjective:  HPI:  Patient here with back pain. This has been a recurring issue for multiple years Usually located in bilateral lower back and then somestimes gets numbness in both of his legs. In the past he has seen a chiropractor and orthopedist for this.  Unfortunately, symptoms have gotten much worse the last few months. The pain in his back is sometimes alleviated by putting pressure in the area. He also has some pain in his right gluteus muscle groups. Pain is only noticeable when he is inactive. Pain is much worse in the morning. He has had xrays in the past but has not had any MRI. Ibuprofen takes care of his symptoms.        Objective:  Physical Exam: BP 127/84   Pulse 81   Temp 98.1 F (36.7 C) (Temporal)   Ht 6' (1.829 m)   Wt 239 lb 6.4 oz (108.6 kg)   SpO2 96%   BMI 32.47 kg/m   Wt Readings from Last 3 Encounters:  05/27/22 239  lb 6.4 oz (108.6 kg)  11/06/21 233 lb 6.4 oz (105.9 kg)  09/22/21 240 lb (108.9 kg)    Gen: No acute distress, resting comfortably CV: Regular rate and rhythm with no murmurs appreciated Pulm: Normal work of breathing, clear to auscultation bilaterally with no crackles, wheezes, or rhonchi Neuro: Grossly normal, moves all extremities MSK: - Back: No deformities.  Nontender to palpation - Legs: No deformities.  Full range of motion throughout.  Straight leg raise negative bilaterally. Psych: Normal affect and thought content      [name] [name], MD 05/27/2022 1:29 PM

## 2022-06-07 NOTE — Progress Notes (Signed)
Benito Mccreedy D.[place] Phone: [phone]   Assessment and Plan:     1. Chronic bilateral low back pain with right-sided sciatica 2. DDD (degenerative disc disease), lumbar -Chronic with exacerbation, initial sports medicine visit - Persistent low back pain for years that has worsened and flared over the past several months with radicular symptoms into right leg, failed conservative therapy under guidance of orthopedic surgeons and primary care physicians for >6 weeks, with pain flares >6/10, affecting day-to-day activities, and no significant findings on imaging.  Because of this we will proceed with MRI of lumbar spine and follow-up with patient to discuss results - X-ray obtained in clinic.  Monitor rotation: No acute fracture or vertebral collapse.  Mild anterior vertebral spurring between L1-L2.  Facet arthropathy most significant at L5.  Mild anterior listhesis of L4 on L5. - Start meloxicam 15 mg daily x2 weeks.  If still having pain after 2 weeks, complete 3rd-week of meloxicam. May use remaining meloxicam as needed once daily for pain control.  Do not to use additional NSAIDs while taking meloxicam.  May use Tylenol [phone] mg 2 to 3 times a day for breakthrough pain. - Start HEP for core and low back   Pertinent previous records reviewed include PCP note 05/28/2019   Follow Up: 3 days after MRI to review results and discuss treatment plan.  Could consider epidural based on results   Subjective:   I, Robert [name], am serving as a Education administrator for Doctor Glennon Mac  Chief Complaint: low back pain   HPI:   06/15/2022 Patient is a 51 year old male complaining of low back pain. Patient states This has been a recurring issue for multiple years Usually located in bilateral lower back and then somestimes gets numbness in both of his legs. In the past he has seen a chiropractor and orthopedist for this.   Unfortunately, symptoms have gotten much worse the last few months. The pain in his back is sometimes alleviated by putting pressure in the area. He also has some pain in his right gluteus muscle groups. Pain is only noticeable when he is inactive. Pain is much worse in the morning. He has had xrays in the past but has not had any MRI. Ibuprofen takes care of his symptoms been going on since 2017 sciatic symptoms, pain stays in the low back but does radiate to his right butt cheek and down , if he take 750 mg if ib it will provide temporary relief, pain is flared from inactivity , does feel better have an orange theory he seems to have relief    Relevant Historical Information:  none pertinent   Additional pertinent review of systems negative.   Current Outpatient Medications:    meloxicam (MOBIC) 15 MG tablet, Take 1 tablet (15 mg total) by mouth daily., Disp: 30 tablet, Rfl: 0   Accu-Chek Softclix Lancets lancets, 1 each by Other route 3 (three) times daily., Disp: 102 each, Rfl: 11   albuterol (VENTOLIN HFA) 108 (90 Base) MCG/ACT inhaler, Inhale 2 puffs into the lungs every 6 (six) hours as needed for wheezing or shortness of breath., Disp: 8 g, Rfl: 6   Blood Glucose Monitoring Suppl (ACCU-CHEK AVIVA PLUS) w/Device KIT, Use 3x day or prn, Disp: 1 kit, Rfl: 0   fluticasone (FLOVENT HFA) 110 MCG/ACT inhaler, Inhale 2 puffs into the lungs 2 (two) times daily as needed., Disp: 36 g, Rfl: 3  glucose blood test strip, Use as instructed, Disp: 100 each, Rfl: 12   metFORMIN (GLUCOPHAGE) 500 MG tablet, TAKE 1 TABLET (500 MG TOTAL) BY MOUTH DAILY., Disp: 90 tablet, Rfl: 0   Objective:     Vitals:   06/15/22 1423  BP: 128/78  Pulse: 96  SpO2: 100%  Weight: 239 lb (108.4 kg)  Height: 6' (1.829 m)      Body mass index is 32.41 kg/m.    Physical Exam:    Gen: Appears well, nad, nontoxic and pleasant Psych: Alert and oriented, appropriate mood and affect Neuro: sensation intact, strength is  5/5 in upper and lower extremities, muscle tone wnl Skin: no susupicious lesions or rashes  Back - Normal skin, Spine with normal alignment and no deformity.   No tenderness to vertebral process palpation.   Paraspinous muscles are not tender and without spasm Straight leg raise positive on right, negative on left Trendelenberg negative    Electronically signed by:  Benito Mccreedy D.[facility] 2:57 PM 06/15/22

## 2022-06-15 ENCOUNTER — Ambulatory Visit: Payer: BC Managed Care – PPO | Admitting: Sports Medicine

## 2022-06-15 ENCOUNTER — Ambulatory Visit (INDEPENDENT_AMBULATORY_CARE_PROVIDER_SITE_OTHER): Payer: BC Managed Care – PPO

## 2022-06-15 VITALS — BP 128/78 | HR 96 | Ht 72.0 in | Wt 239.0 lb

## 2022-06-15 DIAGNOSIS — M5136 Other intervertebral disc degeneration, lumbar region: Secondary | ICD-10-CM

## 2022-06-15 DIAGNOSIS — M5441 Lumbago with sciatica, right side: Secondary | ICD-10-CM | POA: Diagnosis not present

## 2022-06-15 DIAGNOSIS — M545 Low back pain, unspecified: Secondary | ICD-10-CM

## 2022-06-15 DIAGNOSIS — G8929 Other chronic pain: Secondary | ICD-10-CM

## 2022-06-15 DIAGNOSIS — M51369 Other intervertebral disc degeneration, lumbar region without mention of lumbar back pain or lower extremity pain: Secondary | ICD-10-CM

## 2022-06-15 MED ORDER — MELOXICAM 15 MG PO TABS: 15.0000 mg | ORAL_TABLET | Freq: Every day | ORAL | 0 refills | Status: DC

## 2022-06-15 NOTE — Patient Instructions (Addendum)
Good to see you  MRI referral  - Start meloxicam 15 mg daily x2 weeks.  If still having pain after 2 weeks, complete 3rd-week of meloxicam. May use remaining meloxicam as needed once daily for pain control.  Do not to use additional NSAIDs while taking meloxicam.  May use Tylenol [phone] mg 2 to 3 times a day for breakthrough pain. Low back and core HEP  Follow up 3 days after your MRI to discuss your results

## 2022-06-20 ENCOUNTER — Other Ambulatory Visit: Payer: BC Managed Care – PPO

## 2022-06-21 DIAGNOSIS — Z1211 Encounter for screening for malignant neoplasm of colon: Secondary | ICD-10-CM | POA: Diagnosis not present

## 2022-06-25 ENCOUNTER — Other Ambulatory Visit: Payer: Self-pay | Admitting: Family Medicine

## 2022-06-29 LAB — COLOGUARD: COLOGUARD: NEGATIVE

## 2022-06-29 NOTE — Progress Notes (Signed)
Please inform patient of the following:  Great news! Cologuard is negative. We can recheck in 3 years.  Robert [name]. Jimmey Ralph, MD 06/29/2022 12:55 PM

## 2022-07-08 ENCOUNTER — Other Ambulatory Visit: Payer: Self-pay | Admitting: Sports Medicine

## 2022-07-08 DIAGNOSIS — G8929 Other chronic pain: Secondary | ICD-10-CM

## 2022-07-08 DIAGNOSIS — M5136 Other intervertebral disc degeneration, lumbar region: Secondary | ICD-10-CM

## 2022-07-19 ENCOUNTER — Other Ambulatory Visit: Payer: Self-pay | Admitting: Sports Medicine

## 2022-07-19 ENCOUNTER — Telehealth: Payer: Self-pay

## 2022-07-19 MED ORDER — CYCLOBENZAPRINE HCL 5 MG PO TABS: 5.0000 mg | ORAL_TABLET | Freq: Every evening | ORAL | 0 refills | Status: DC | PRN

## 2022-07-19 NOTE — Telephone Encounter (Signed)
Flexeril 5 mg nightly 15 [facility]RF [facility] [place]

## 2022-07-19 NOTE — Telephone Encounter (Signed)
Received a denial for patient mri of low back MRI  Reasoning stated below   1) pain has not improved after six weeks of treatment 2) treatment should include medication and other forms of therapy. These include home exercises or physical therapy. 3) you doctor should use the test results to decide on the best treatment plan for you. You may need to have a procedure or surgery.  The notes received did not show that patient had at least six weeks of such treatment. The notes did not show that patient is going to have surgery or a procedure. Based on that info the MRI was deemed not medically necessary.   Can call for a provider courtesy review at [phone] Extension [phone]  Reference ID [id number]

## 2022-07-26 ENCOUNTER — Encounter: Payer: Self-pay | Admitting: *Deleted

## 2022-08-06 NOTE — Progress Notes (Signed)
Benito Mccreedy D.[place] Phone: [phone]   Assessment and Plan:     1. Chronic bilateral low back pain with right-sided sciatica 2. DDD (degenerative disc disease), lumbar -Chronic with exacerbation, subsequent visit - Continued low back pain for years that is worsened over the past several months and has not improved with our conservative treatment for >6 weeks - We will proceed with MRI of lumbar spine for further evaluation as patient has failed conservative therapy for 6 weeks under my treatment which has included meloxicam and home exercise program, patient continues to have pain flares >6/10, and symptoms are affecting day-to-day activities. - Discontinue meloxicam and Flexeril at this time as they have not provided significant relief - MR Lumbar Spine Wo Contrast; Future    Pertinent previous records reviewed include none   Follow Up: 3 days after MRI to review results and discuss treatment plan.  Could consider epidural based on results   Subjective:   I, Robert [name], am serving as a Education administrator for Doctor Glennon Mac   Chief Complaint: low back pain    HPI:    06/15/2022 Patient is a 51 year old male complaining of low back pain. Patient states This has been a recurring issue for multiple years Usually located in bilateral lower back and then somestimes gets numbness in both of his legs. In the past he has seen a chiropractor and orthopedist for this.  Unfortunately, symptoms have gotten much worse the last few months. The pain in his back is sometimes alleviated by putting pressure in the area. He also has some pain in his right gluteus muscle groups. Pain is only noticeable when he is inactive. Pain is much worse in the morning. He has had xrays in the past but has not had any MRI. 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  Patient was unable to tolerate Flexeril due to "hangover feeling" the next morning  Relevant Historical Information:  none pertinent   Additional pertinent review of systems negative.   Current Outpatient Medications:    Accu-Chek Softclix Lancets lancets, 1 each by Other route 3 (three) times daily., Disp: 102 each, Rfl: 11   albuterol (VENTOLIN HFA) 108 (90 Base) MCG/ACT inhaler, Inhale 2 puffs into the lungs every 6 (six) hours as needed for wheezing or shortness of breath., Disp: 8 g, Rfl: 6   Blood Glucose Monitoring Suppl (ACCU-CHEK AVIVA PLUS) w/Device KIT, Use 3x day or prn, Disp: 1 kit, Rfl: 0   cyclobenzaprine (FLEXERIL) 5 MG tablet, Take 1 tablet (5 mg total) by mouth at bedtime as needed for muscle spasms., Disp: 15 tablet, Rfl: 0   fluticasone (FLOVENT HFA) 110 MCG/ACT inhaler, Inhale 2 puffs into the lungs 2 (two) times daily as needed.,  Disp: 36 g, Rfl: 3   glucose blood test strip, Use as instructed, Disp: 100 each, Rfl: 12   meloxicam (MOBIC) 15 MG tablet, Take 1 tablet (15 mg total) by mouth daily., Disp: 30 tablet, Rfl: 0   metFORMIN (GLUCOPHAGE) 500 MG tablet, TAKE 1 TABLET (500 MG TOTAL) BY MOUTH DAILY., Disp: 90 tablet, Rfl: 0   Objective:     Vitals:   08/09/22 1250  BP: 122/80  Pulse: 67  SpO2: 97%  Weight: 241 lb (109.3 kg)  Height: 6' (1.829 m)      Body mass index is 32.69 kg/m.    Physical Exam:    Gen: Appears well, nad, nontoxic and pleasant Psych: Alert and  oriented, appropriate mood and affect Neuro: sensation intact, strength is 5/5 in upper and lower extremities, muscle tone wnl Skin: no susupicious lesions or rashes   Back - Normal skin, Spine with normal alignment and no deformity.   No tenderness to vertebral process palpation.   Right lumbar paraspinous muscles are mildly tender and without spasm Straight leg raise positive on right, negative on left Trendelenberg negative     Electronically signed by:  Benito Mccreedy D.[facility] 1:41 PM 08/09/22

## 2022-08-09 ENCOUNTER — Ambulatory Visit: Payer: BC Managed Care – PPO | Admitting: Sports Medicine

## 2022-08-09 VITALS — BP 122/80 | HR 67 | Ht 72.0 in | Wt 241.0 lb

## 2022-08-09 DIAGNOSIS — G8929 Other chronic pain: Secondary | ICD-10-CM | POA: Diagnosis not present

## 2022-08-09 DIAGNOSIS — M5136 Other intervertebral disc degeneration, lumbar region: Secondary | ICD-10-CM | POA: Diagnosis not present

## 2022-08-09 DIAGNOSIS — M5441 Lumbago with sciatica, right side: Secondary | ICD-10-CM | POA: Diagnosis not present

## 2022-08-09 NOTE — Patient Instructions (Addendum)
Good to see you  MRI referral  Follow up 3 days after your MRI to discuss your results   

## 2022-08-15 ENCOUNTER — Ambulatory Visit (INDEPENDENT_AMBULATORY_CARE_PROVIDER_SITE_OTHER): Payer: BC Managed Care – PPO

## 2022-08-15 DIAGNOSIS — M5136 Other intervertebral disc degeneration, lumbar region: Secondary | ICD-10-CM

## 2022-08-15 DIAGNOSIS — G8929 Other chronic pain: Secondary | ICD-10-CM | POA: Diagnosis not present

## 2022-08-15 DIAGNOSIS — M5441 Lumbago with sciatica, right side: Secondary | ICD-10-CM | POA: Diagnosis not present

## 2022-08-15 DIAGNOSIS — M545 Low back pain, unspecified: Secondary | ICD-10-CM | POA: Diagnosis not present

## 2022-08-19 ENCOUNTER — Ambulatory Visit: Payer: BC Managed Care – PPO | Admitting: Sports Medicine

## 2022-08-19 VITALS — BP 120/84 | HR 74 | Ht 72.0 in | Wt 241.0 lb

## 2022-08-19 DIAGNOSIS — G8929 Other chronic pain: Secondary | ICD-10-CM | POA: Diagnosis not present

## 2022-08-19 DIAGNOSIS — M5136 Other intervertebral disc degeneration, lumbar region: Secondary | ICD-10-CM | POA: Diagnosis not present

## 2022-08-19 DIAGNOSIS — M5441 Lumbago with sciatica, right side: Secondary | ICD-10-CM | POA: Diagnosis not present

## 2022-08-19 DIAGNOSIS — M47816 Spondylosis without myelopathy or radiculopathy, lumbar region: Secondary | ICD-10-CM

## 2022-08-19 MED ORDER — METHYLPREDNISOLONE 4 MG PO TBPK: ORAL_TABLET | ORAL | 0 refills | Status: DC

## 2022-08-19 NOTE — Patient Instructions (Addendum)
Good to see you  Epidural [facility] [phone] Prednisone dos pak that you should use if you cannot get the injection at the tim e Follow up 2 weeks after injection or after your trip

## 2022-08-19 NOTE — Progress Notes (Signed)
Robert [name] D.[place] Phone: [phone]   Assessment and Plan:     1. Chronic bilateral low back pain with right-sided sciatica 2. DDD (degenerative disc disease), lumbar 3. Lumbar facet arthropathy  -Chronic with exacerbation, subsequent visit - Reviewed patient's MRI with patient in the room and showed L5-S1 facet arthropathy, disc herniation, 3 mm anterior listhesis that are likely the underlying causes of patient's symptoms - We will plan for epidural to right-sided L5-S1.  Patiently use for a trip to [place] next Tuesday, so we will attempt to have patient get injection before this time.  If he cannot get the injection before his trip, I recommend that he use prednisone Dosepak.  Prescription provided today  Pertinent previous records reviewed include lumbar MRI 08/15/2022   Follow Up: 2 weeks after epidural to review improvement.  If no improvement or worsening of symptoms, could consider facet injections at L5-S1   Subjective:   I, Robert [name], am serving as a Education administrator for Doctor Glennon Mac   Chief Complaint: low back pain    HPI:    06/15/2022 Patient is a 51 year old male complaining of low back pain. Patient states This has been a recurring issue for multiple years Usually located in bilateral lower back and then somestimes gets numbness in both of his legs. In the past he has seen a chiropractor and orthopedist for this.  Unfortunately, symptoms have gotten much worse the last few months. The pain in his back is sometimes alleviated by putting pressure in the area. He also has some pain in his right gluteus muscle groups. Pain is only noticeable when he is inactive. Pain is much worse in the morning. He has had xrays in the past but has not had any MRI. 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  Patient was unable to tolerate Flexeril due to "hangover feeling" the next morning  08/19/2022 Patient states that he is the same had a little flare last night no ain just felt weird   Relevant Historical Information:  none pertinent     Additional pertinent review of systems negative.   Current Outpatient Medications:    methylPREDNISolone (MEDROL DOSEPAK) 4 MG TBPK tablet, Take 6 tablets on day 1.  Take 5 tablets on day 2.  Take 4 tablets on day 3.  Take 3 tablets on day 4.  Take 2 tablets on day 5.  Take 1 tablet on day 6., Disp: 21 tablet, Rfl: 0   Accu-Chek Softclix Lancets lancets, 1 each by Other route 3 (three) times daily., Disp: 102 each, Rfl: 11   albuterol (VENTOLIN HFA) 108 (90 Base) MCG/ACT inhaler, Inhale 2 puffs into the lungs every 6 (six) hours as needed for wheezing or shortness of  breath., Disp: 8 g, Rfl: 6   Blood Glucose Monitoring Suppl (ACCU-CHEK AVIVA PLUS) w/Device KIT, Use 3x day or prn, Disp: 1 kit, Rfl: 0   cyclobenzaprine (FLEXERIL) 5 MG tablet, Take 1 tablet (5 mg total) by mouth at bedtime as needed for muscle spasms., Disp: 15 tablet, Rfl: 0   fluticasone (FLOVENT HFA) 110 MCG/ACT inhaler, Inhale 2 puffs into the lungs 2 (two) times daily as needed., Disp: 36 g, Rfl: 3   glucose blood test strip, Use as instructed, Disp: 100 each, Rfl: 12   meloxicam (MOBIC) 15 MG tablet, Take 1 tablet (15 mg total) by mouth daily., Disp: 30 tablet, Rfl: 0   metFORMIN (GLUCOPHAGE) 500 MG tablet, TAKE 1 TABLET (500 MG  TOTAL) BY MOUTH DAILY., Disp: 90 tablet, Rfl: 0   Objective:     Vitals:   08/19/22 1054  BP: 120/84  Pulse: 74  SpO2: 97%  Weight: 241 lb (109.3 kg)  Height: 6' (1.829 m)      Body mass index is 32.69 kg/m.    Physical Exam:    Gen: Appears well, nad, nontoxic and pleasant Psych: Alert and oriented, appropriate mood and affect Neuro: sensation intact, strength is 5/5 in upper and lower extremities, muscle tone wnl Skin: no susupicious lesions or rashes   Back - Normal skin, Spine with normal alignment and no deformity.   No tenderness to vertebral process palpation.   Right lumbar paraspinous muscles are mildly tender and without spasm Straight leg raise positive on right, negative on left Trendelenberg negative   Electronically signed by:  Robert [name] D.[facility] 11:16 AM 08/19/22

## 2022-08-20 ENCOUNTER — Encounter: Payer: Self-pay | Admitting: Sports Medicine

## 2022-08-20 NOTE — Telephone Encounter (Signed)
Pt was sent letter via mychart and one was printed and placed up front at desk

## 2022-08-23 ENCOUNTER — Ambulatory Visit
Admission: RE | Admit: 2022-08-23 | Discharge: 2022-08-23 | Disposition: A | Discharge status: Home / Self Care | Payer: BC Managed Care – PPO | Source: Ambulatory Visit | Attending: Sports Medicine | Admitting: Sports Medicine

## 2022-08-23 DIAGNOSIS — M5136 Other intervertebral disc degeneration, lumbar region: Secondary | ICD-10-CM

## 2022-08-23 DIAGNOSIS — G8929 Other chronic pain: Secondary | ICD-10-CM

## 2022-08-23 DIAGNOSIS — M47816 Spondylosis without myelopathy or radiculopathy, lumbar region: Secondary | ICD-10-CM

## 2022-08-23 DIAGNOSIS — M47817 Spondylosis without myelopathy or radiculopathy, lumbosacral region: Secondary | ICD-10-CM | POA: Diagnosis not present

## 2022-08-23 MED ORDER — IOPAMIDOL (ISOVUE-M 200) INJECTION 41%
1.0000 mL | Freq: Once | INTRAMUSCULAR | Status: AC
  Administered 2022-08-23: 1 mL via EPIDURAL

## 2022-08-23 MED ORDER — METHYLPREDNISOLONE ACETATE 40 MG/ML INJ SUSP (RADIOLOG
80.0000 mg | Freq: Once | INTRAMUSCULAR | Status: AC
  Administered 2022-08-23: 80 mg via EPIDURAL

## 2022-08-23 NOTE — Discharge Instructions (Signed)

## 2022-09-16 DIAGNOSIS — Z131 Encounter for screening for diabetes mellitus: Secondary | ICD-10-CM | POA: Diagnosis not present

## 2022-09-16 DIAGNOSIS — R739 Hyperglycemia, unspecified: Secondary | ICD-10-CM | POA: Diagnosis not present

## 2022-09-16 DIAGNOSIS — R5383 Other fatigue: Secondary | ICD-10-CM | POA: Diagnosis not present

## 2022-09-16 DIAGNOSIS — E669 Obesity, unspecified: Secondary | ICD-10-CM | POA: Diagnosis not present

## 2022-09-16 DIAGNOSIS — Z6831 Body mass index (BMI) 31.0-31.9, adult: Secondary | ICD-10-CM | POA: Diagnosis not present

## 2022-09-16 DIAGNOSIS — E8889 Other specified metabolic disorders: Secondary | ICD-10-CM | POA: Diagnosis not present

## 2022-09-16 DIAGNOSIS — Z1331 Encounter for screening for depression: Secondary | ICD-10-CM | POA: Diagnosis not present

## 2022-09-21 ENCOUNTER — Ambulatory Visit: Payer: BC Managed Care – PPO | Admitting: Sports Medicine

## 2022-09-21 VITALS — BP 122/82 | HR 91 | Ht 72.0 in | Wt 241.0 lb

## 2022-09-21 DIAGNOSIS — M47816 Spondylosis without myelopathy or radiculopathy, lumbar region: Secondary | ICD-10-CM | POA: Diagnosis not present

## 2022-09-21 DIAGNOSIS — G8929 Other chronic pain: Secondary | ICD-10-CM | POA: Diagnosis not present

## 2022-09-21 DIAGNOSIS — M5441 Lumbago with sciatica, right side: Secondary | ICD-10-CM | POA: Diagnosis not present

## 2022-09-21 DIAGNOSIS — M5136 Other intervertebral disc degeneration, lumbar region: Secondary | ICD-10-CM | POA: Diagnosis not present

## 2022-09-21 NOTE — Patient Instructions (Addendum)
Good to see you  Epidural referral right L5-S1 Follow up 2 weeks after to discuss results

## 2022-09-21 NOTE — Progress Notes (Signed)
Benito Mccreedy D.[place] Phone: [phone]   Assessment and Plan:     1. Chronic bilateral low back pain with right-sided sciatica 2. DDD (degenerative disc disease), lumbar 3. Lumbar facet arthropathy -Chronic exacerbation, subsequent visit - Overall "90%" improvement after right-sided L5-S1 epidural CSI performed on 08/23/2022 with mild flare of pain occurring last night - Due to patient's significant, however incomplete improvement after epidural CSI, we will repeat epidural CSI at same location, right-sided L5-S1 with goal of more complete resolution - Patient has a trip to [place] and is leaving next Wednesday, 09/29/2022.  Patient can have injection either before this trip or after this trip and follow-up with me 2 weeks after epidural - Continue HEP - May use Tylenol as needed for day-to-day pain relief - Recommended avoiding orange therapy at this time as the intensity of these exercises may aggravate his symptoms.  Instead recommend HEP and self monitored gym program to gradually increase strength and weight -- DG INJECT DIAG/THERA/INC NEEDLE/CATH/PLC EPI/LUMB/SAC W/IMG; Future    Pertinent previous records reviewed include epidural procedure note 08/23/2022   Follow Up:  Patient has a trip to [place] and is leaving next Wednesday, 09/29/2022.  Patient can have injection either before this trip or after this trip and follow-up with me 2 weeks after epidural     Subjective:   I, Robert [name], am serving as a Education administrator for Doctor Glennon Mac   Chief Complaint: low back pain    HPI:    06/15/2022 Patient is a 51 year old male complaining of low back pain. Patient states This has been a recurring issue for multiple years Usually located in bilateral lower back and then somestimes gets numbness in both of his legs. In the past he has seen a chiropractor and orthopedist for this.  Unfortunately,  symptoms have gotten much worse the last few months. The pain in his back is sometimes alleviated by putting pressure in the area. He also has some pain in his right gluteus muscle groups. Pain is only noticeable when he is inactive. Pain is much worse in the morning. He has had xrays in the past but has not had any MRI. 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  Patient was unable to tolerate Flexeril due to "hangover feeling" the next morning   08/19/2022 Patient states that he is the same had a little flare last night no ain just felt weird   09/21/2022 Patient states he is okay , been 4 1/2 weeks most of the time 90% success ,did have a flare last night he was moving christmas boxes and had a sharpe pain that triggered a prolonged pain that  treated with ib , wants to discuss exercises    Relevant Historical  Information:  none pertinent   Additional pertinent review of systems negative.   Current Outpatient Medications:    Accu-Chek Softclix Lancets lancets, 1 each by Other route 3 (three) times daily., Disp: 102 each, Rfl: 11   albuterol (VENTOLIN HFA) 108 (90 Base) MCG/ACT inhaler, Inhale 2 puffs into the lungs every 6 (six) hours as needed for wheezing or shortness of breath., Disp: 8 g, Rfl: 6   Blood Glucose Monitoring Suppl (ACCU-CHEK AVIVA PLUS) w/Device KIT, Use 3x day or prn, Disp: 1 kit, Rfl: 0   cyclobenzaprine (FLEXERIL) 5  MG tablet, Take 1 tablet (5 mg total) by mouth at bedtime as needed for muscle spasms., Disp: 15 tablet, Rfl: 0   fluticasone (FLOVENT HFA) 110 MCG/ACT inhaler, Inhale 2 puffs into the lungs 2 (two) times daily as needed., Disp: 36 g, Rfl: 3   glucose blood test strip, Use as instructed, Disp: 100 each, Rfl: 12   meloxicam (MOBIC) 15 MG tablet, Take 1 tablet (15 mg total) by mouth daily., Disp: 30 tablet, Rfl: 0   metFORMIN (GLUCOPHAGE) 500 MG tablet, TAKE 1 TABLET (500 MG TOTAL) BY MOUTH DAILY., Disp: 90 tablet, Rfl: 0   methylPREDNISolone (MEDROL DOSEPAK) 4 MG TBPK tablet, Take 6 tablets on day 1.  Take 5 tablets on day 2.  Take 4 tablets on day 3.  Take 3 tablets on day 4.  Take 2 tablets on day 5.  Take 1 tablet on day 6., Disp: 21 tablet, Rfl: 0   Objective:     Vitals:   09/21/22 0920  BP: 122/82  Pulse: 91  SpO2: 97%  Weight: 241 lb (109.3 kg)  Height: 6' (1.829 m)      Body mass index is 32.69 kg/m.    Physical Exam:    Gen: Appears well, nad, nontoxic and pleasant Psych: Alert and oriented, appropriate mood and affect Neuro: sensation intact, strength is 5/5 in upper and lower extremities, muscle tone wnl Skin: no susupicious lesions or rashes   Back - Normal skin, Spine with normal alignment and no deformity.   No tenderness to vertebral process palpation.   Right lumbar paraspinous muscles are mildly tender and without spasm Straight leg raise negative on right, negative on left Trendelenberg negative    Electronically signed by:  Benito Mccreedy D.[facility] 9:45 AM 09/21/22

## 2022-09-27 ENCOUNTER — Ambulatory Visit
Admission: RE | Admit: 2022-09-27 | Discharge: 2022-09-27 | Disposition: A | Discharge status: Home / Self Care | Payer: BC Managed Care – PPO | Source: Ambulatory Visit | Attending: Sports Medicine | Admitting: Sports Medicine

## 2022-09-27 DIAGNOSIS — M47816 Spondylosis without myelopathy or radiculopathy, lumbar region: Secondary | ICD-10-CM

## 2022-09-27 DIAGNOSIS — G8929 Other chronic pain: Secondary | ICD-10-CM

## 2022-09-27 DIAGNOSIS — M47817 Spondylosis without myelopathy or radiculopathy, lumbosacral region: Secondary | ICD-10-CM | POA: Diagnosis not present

## 2022-09-27 DIAGNOSIS — M5136 Other intervertebral disc degeneration, lumbar region: Secondary | ICD-10-CM

## 2022-09-27 MED ORDER — METHYLPREDNISOLONE ACETATE 40 MG/ML INJ SUSP (RADIOLOG
80.0000 mg | Freq: Once | INTRAMUSCULAR | Status: AC
  Administered 2022-09-27: 80 mg via EPIDURAL

## 2022-09-27 MED ORDER — IOPAMIDOL (ISOVUE-M 200) INJECTION 41%
1.0000 mL | Freq: Once | INTRAMUSCULAR | Status: AC
  Administered 2022-09-27: 1 mL via EPIDURAL

## 2022-09-27 NOTE — Discharge Instructions (Signed)

## 2022-09-29 DIAGNOSIS — E88819 Insulin resistance, unspecified: Secondary | ICD-10-CM | POA: Diagnosis not present

## 2022-09-29 DIAGNOSIS — R739 Hyperglycemia, unspecified: Secondary | ICD-10-CM | POA: Diagnosis not present

## 2022-09-29 DIAGNOSIS — E785 Hyperlipidemia, unspecified: Secondary | ICD-10-CM | POA: Diagnosis not present

## 2022-09-29 DIAGNOSIS — E559 Vitamin D deficiency, unspecified: Secondary | ICD-10-CM | POA: Diagnosis not present

## 2022-10-14 ENCOUNTER — Encounter: Payer: Self-pay | Admitting: *Deleted

## 2022-11-03 ENCOUNTER — Encounter: Payer: Self-pay | Admitting: Sports Medicine

## 2022-11-09 DIAGNOSIS — E785 Hyperlipidemia, unspecified: Secondary | ICD-10-CM | POA: Diagnosis not present

## 2022-11-09 DIAGNOSIS — E559 Vitamin D deficiency, unspecified: Secondary | ICD-10-CM | POA: Diagnosis not present

## 2022-11-09 DIAGNOSIS — E88819 Insulin resistance, unspecified: Secondary | ICD-10-CM | POA: Diagnosis not present

## 2022-11-19 ENCOUNTER — Telehealth: Payer: Self-pay | Admitting: Pulmonary Disease

## 2022-11-19 NOTE — Telephone Encounter (Signed)
Received a call from Jackqulyn Livings with [place] from [employer]. They stated that they need to have a replacement inhaler for Flovent due to manufacturer discontinuing making the brand name Flovent as the generic is less.  Unfortunately with pt's insurance, generic is not available. Due to this, they need to have a different inhaler prescribed for pt.  Routing to prior auth team as well as Dr. Erin Fulling. Please advise.

## 2022-11-26 NOTE — Progress Notes (Unsigned)
Benito Mccreedy D.[place] Phone: [phone]   Assessment and Plan:     There are no diagnoses linked to this encounter.  ***   Pertinent previous records reviewed include ***   Follow Up: ***     Subjective:   I, Robert [name], am serving as a Education administrator for Doctor Glennon Mac   Chief Complaint: low back pain    HPI:    06/15/2022 Patient is a 52 year old male complaining of low back pain. Patient states This has been a recurring issue for multiple years Usually located in bilateral lower back and then somestimes gets numbness in both of his legs. In the past he has seen a chiropractor and orthopedist for this.  Unfortunately, symptoms have gotten much worse the last few months. The pain in his back is sometimes alleviated by putting pressure in the area. He also has some pain in his right gluteus muscle groups. Pain is only noticeable when he is inactive. Pain is much worse in the morning. He has had xrays in the past but has not had any MRI. 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  Patient was unable to tolerate Flexeril due to "hangover feeling" the next morning   08/19/2022 Patient states that he is the same had a little flare last night no ain just felt weird   09/21/2022 Patient states he is okay , been 4 1/2 weeks most of the time 90%  success ,did have a flare last night he was moving christmas boxes and had a sharpe pain that triggered a prolonged pain that  treated with ib , wants to discuss exercises    11/29/2022 Patient states    Relevant Historical Information:  none pertinent   Additional pertinent review of systems negative.   Current Outpatient Medications:    Accu-Chek Softclix Lancets lancets, 1 each by Other route 3 (three) times daily., Disp: 102 each, Rfl: 11   albuterol (VENTOLIN HFA) 108 (90 Base) MCG/ACT inhaler, Inhale 2 puffs into the lungs every 6 (six) hours as needed for wheezing or shortness of breath., Disp: 8 g, Rfl: 6   Blood Glucose Monitoring Suppl (ACCU-CHEK AVIVA PLUS) w/Device KIT, Use 3x day or prn, Disp: 1 kit, Rfl: 0   cyclobenzaprine (FLEXERIL) 5 MG tablet, Take 1 tablet (5 mg total) by mouth at bedtime as needed for muscle spasms., Disp: 15 tablet, Rfl: 0   fluticasone (FLOVENT HFA) 110 MCG/ACT inhaler, Inhale 2 puffs into the lungs 2 (two) times daily as needed., Disp: 36 g, Rfl: 3   glucose blood test strip, Use as instructed, Disp: 100 each, Rfl: 12   meloxicam (MOBIC) 15 MG tablet, Take 1 tablet (15 mg total) by mouth daily., Disp: 30 tablet, Rfl: 0  metFORMIN (GLUCOPHAGE) 500 MG tablet, TAKE 1 TABLET (500 MG TOTAL) BY MOUTH DAILY., Disp: 90 tablet, Rfl: 0   methylPREDNISolone (MEDROL DOSEPAK) 4 MG TBPK tablet, Take 6 tablets on day 1.  Take 5 tablets on day 2.  Take 4 tablets on day 3.  Take 3 tablets on day 4.  Take 2 tablets on day 5.  Take 1 tablet on day 6., Disp: 21 tablet, Rfl: 0   Objective:     There were no vitals filed for this visit.    There is no height or weight on file to calculate BMI.    Physical Exam:    ***   Electronically signed by:  Benito Mccreedy D.[facility] 11:59 AM 11/26/22

## 2022-11-29 ENCOUNTER — Ambulatory Visit (INDEPENDENT_AMBULATORY_CARE_PROVIDER_SITE_OTHER): Payer: BC Managed Care – PPO | Admitting: Sports Medicine

## 2022-11-29 VITALS — HR 94 | Ht 72.0 in | Wt 233.0 lb

## 2022-11-29 DIAGNOSIS — G8929 Other chronic pain: Secondary | ICD-10-CM

## 2022-11-29 DIAGNOSIS — M47816 Spondylosis without myelopathy or radiculopathy, lumbar region: Secondary | ICD-10-CM

## 2022-11-29 DIAGNOSIS — M5441 Lumbago with sciatica, right side: Secondary | ICD-10-CM | POA: Diagnosis not present

## 2022-11-29 DIAGNOSIS — M5136 Other intervertebral disc degeneration, lumbar region: Secondary | ICD-10-CM | POA: Diagnosis not present

## 2022-11-29 NOTE — Patient Instructions (Addendum)
Good to see you  Right side L5-S1 Follow up 2 weeks after injection to discuss results

## 2022-12-05 NOTE — Telephone Encounter (Signed)
Any updates on this?

## 2022-12-06 ENCOUNTER — Encounter: Payer: Self-pay | Admitting: Sports Medicine

## 2022-12-06 NOTE — Telephone Encounter (Signed)
Any updates on this?

## 2022-12-09 ENCOUNTER — Encounter: Payer: Self-pay | Admitting: Family Medicine

## 2022-12-09 ENCOUNTER — Ambulatory Visit (INDEPENDENT_AMBULATORY_CARE_PROVIDER_SITE_OTHER): Payer: BC Managed Care – PPO | Admitting: Family Medicine

## 2022-12-09 VITALS — BP 125/79 | HR 74 | Temp 97.1°F | Ht 72.0 in | Wt 232.0 lb

## 2022-12-09 DIAGNOSIS — R739 Hyperglycemia, unspecified: Secondary | ICD-10-CM | POA: Diagnosis not present

## 2022-12-09 DIAGNOSIS — E785 Hyperlipidemia, unspecified: Secondary | ICD-10-CM | POA: Diagnosis not present

## 2022-12-09 DIAGNOSIS — Z125 Encounter for screening for malignant neoplasm of prostate: Secondary | ICD-10-CM | POA: Diagnosis not present

## 2022-12-09 DIAGNOSIS — Z0001 Encounter for general adult medical examination with abnormal findings: Secondary | ICD-10-CM

## 2022-12-09 DIAGNOSIS — Z1159 Encounter for screening for other viral diseases: Secondary | ICD-10-CM

## 2022-12-09 DIAGNOSIS — E669 Obesity, unspecified: Secondary | ICD-10-CM

## 2022-12-09 DIAGNOSIS — M5441 Lumbago with sciatica, right side: Secondary | ICD-10-CM

## 2022-12-09 DIAGNOSIS — G8929 Other chronic pain: Secondary | ICD-10-CM

## 2022-12-09 DIAGNOSIS — Z23 Encounter for immunization: Secondary | ICD-10-CM

## 2022-12-09 DIAGNOSIS — R053 Chronic cough: Secondary | ICD-10-CM

## 2022-12-09 DIAGNOSIS — N529 Male erectile dysfunction, unspecified: Secondary | ICD-10-CM

## 2022-12-09 LAB — COMPREHENSIVE METABOLIC PANEL
ALT: 29 U/L (ref 0–53)
AST: 20 U/L (ref 0–37)
Albumin: 4.3 g/dL (ref 3.5–5.2)
Alkaline Phosphatase: 47 U/L (ref 39–117)
BUN: 22 mg/dL (ref 6–23)
CO2: 26 mEq/L (ref 19–32)
Calcium: 9.5 mg/dL (ref 8.4–10.5)
Chloride: 104 mEq/L (ref 96–112)
Creatinine, Ser: 1.18 mg/dL (ref 0.40–1.50)
GFR: 71.03 mL/min (ref >60.00)
Glucose, Bld: 95 mg/dL (ref 70–99)
Potassium: 4.3 mEq/L (ref 3.5–5.1)
Sodium: 140 mEq/L (ref 135–145)
Total Bilirubin: 0.6 mg/dL (ref 0.2–1.2)
Total Protein: 6.9 g/dL (ref 6.0–8.3)

## 2022-12-09 LAB — CBC
HCT: 46.3 % (ref 39.0–52.0)
Hemoglobin: 15.8 g/dL (ref 13.0–17.0)
MCHC: 34.2 g/dL (ref 30.0–36.0)
MCV: 85 fl (ref 78.0–100.0)
Platelets: 255 10*3/uL (ref 150.0–400.0)
RBC: 5.44 Mil/uL (ref 4.22–5.81)
RDW: 13.5 % (ref 11.5–15.5)
WBC: 6.4 10*3/uL (ref 4.0–10.5)

## 2022-12-09 LAB — LIPID PANEL
Cholesterol: 183 mg/dL (ref 0–200)
HDL: 33.2 mg/dL — ABNORMAL LOW (ref >39.00)
LDL Cholesterol: 113 mg/dL — ABNORMAL HIGH (ref 0–99)
NonHDL: 149.97
Total CHOL/HDL Ratio: 6
Triglycerides: 183 mg/dL — ABNORMAL HIGH (ref 0.0–149.0)
VLDL: 36.6 mg/dL (ref 0.0–40.0)

## 2022-12-09 LAB — PSA: PSA: 2.2 ng/mL (ref 0.10–4.00)

## 2022-12-09 LAB — TSH: TSH: 2.23 u[IU]/mL (ref 0.35–5.50)

## 2022-12-09 LAB — HEMOGLOBIN A1C: Hgb A1c MFr Bld: 5.1 % (ref 4.6–6.5)

## 2022-12-09 LAB — TESTOSTERONE: Testosterone: 394.09 ng/dL (ref 300.00–890.00)

## 2022-12-09 MED ORDER — TADALAFIL 10 MG PO TABS: 10.0000 mg | ORAL_TABLET | ORAL | 1 refills | Status: DC | PRN

## 2022-12-09 NOTE — Assessment & Plan Note (Signed)
He is doing well with lifestyle modifications.  Check labs today.

## 2022-12-09 NOTE — Progress Notes (Signed)
Chief Complaint:  Robert [name] is a 52 y.o. male who presents today for his annual comprehensive physical exam.    Assessment/Plan:  Chronic Problems Addressed Today: Erectile dysfunction No red flags.  Discussed treatment options with patient.  Will be checking testosterone per patient request.  May have psychogenic ED.  We will start Cialis 10 mg daily.  We discussed potential side effects.  He will follow-up in a few weeks via MyChart  Dyslipidemia He is doing well with lifestyle modifications.  Check labs today.  Obesity Following with medical weight management.  Now on Zepbound 5mg  weekly.  Seems to be doing well at this dose.  Hyperglycemia Check A1c.  Chronic low back pain with sciatica Following with sports medicine.   Chronic cough Follows with pulmonology.  Does use Flovent as needed seasonally typically in the fall but overall symptoms been stable.  Reassuring exam today.  Preventative Healthcare: Check labs.  Had Cologuard last year which was normal.  Up-to-date on vaccines.  Patient Counseling(The following topics were reviewed and/or handout was given):  -Nutrition: Stressed importance of moderation in sodium/caffeine intake, saturated fat and cholesterol, caloric balance, sufficient intake of fresh fruits, vegetables, and fiber.  -Stressed the importance of regular exercise.   -Substance Abuse: Discussed cessation/primary prevention of tobacco, alcohol, or other drug use; driving or other dangerous activities under the influence; availability of treatment for abuse.   -Injury prevention: Discussed safety belts, safety helmets, smoke detector, smoking near bedding or upholstery.   -Sexuality: Discussed sexually transmitted diseases, partner selection, use of condoms, avoidance of unintended pregnancy and contraceptive alternatives.   -Dental health: Discussed importance of regular tooth brushing, flossing, and dental visits.  -Health maintenance and  immunizations reviewed. Please refer to Health maintenance section.  Return to care in 1 year for next preventative visit.     Subjective:  HPI:  He has no acute complaints today. See A/p for status of chronic conditions.   Has been having more issues with erectile dysfunction over the last several months.  Difficulty with getting erection and keeping erection.  No pain.  Lifestyle Diet: Balanced. Plenty of fruits and vegetables.  Exercise: Works on [place].      12/09/2022    8:31 AM  Depression screen PHQ 2/9  Decreased Interest 0  Down, Depressed, Hopeless 0  PHQ - 2 Score 0    Health Maintenance Due  Topic Date Due   Hepatitis C Screening  Never done     ROS: Per HPI, otherwise a complete review of systems was negative.   PMH:  The following were reviewed and entered/updated in epic: Past Medical History:  Diagnosis Date   Asthma dx aprox 2002   exercise induced , rarely has sx    Seasonal allergies    Patient Active Problem List   Diagnosis Date Noted   Erectile dysfunction 12/09/2022   Obesity 11/06/2021   Chronic cough 01/12/2021   Insomnia 06/12/2019   Dyslipidemia 05/09/2018   Hyperglycemia 05/09/2018   Chronic low back pain with sciatica 05/09/2018   Past Surgical History:  Procedure Laterality Date   VASECTOMY  2013    Family History  Problem Relation Age of Onset   Arthritis Other    Hyperlipidemia Other    Heart disease Other        no early dz    Hypertension Other    Rheum arthritis Mother    Dystonia Mother    COPD Father    Alcohol abuse Father  Colon cancer Neg Hx    Prostate cancer Neg Hx     Medications- reviewed and updated Current Outpatient Medications  Medication Sig Dispense Refill   tadalafil (CIALIS) 10 MG tablet Take 1 tablet (10 mg total) by mouth every other day as needed for erectile dysfunction. 10 tablet 1   tirzepatide (ZEPBOUND) 5 MG/0.5ML Pen Inject 5 mg into the skin once a week.     albuterol  (VENTOLIN HFA) 108 (90 Base) MCG/ACT inhaler Inhale 2 puffs into the lungs every 6 (six) hours as needed for wheezing or shortness of breath. (Patient not taking: Reported on 12/09/2022) 8 g 6   fluticasone (FLOVENT HFA) 110 MCG/ACT inhaler Inhale 2 puffs into the lungs 2 (two) times daily as needed. (Patient not taking: Reported on 12/09/2022) 36 g 3   No current facility-administered medications for this visit.    Allergies-reviewed and updated No Known Allergies  Social History   Socioeconomic History   Marital status: Married    Spouse name: Not on file   Number of children: 2   Years of education: Not on file   Highest education level: Not on file  Occupational History   Occupation: attorney (immigration)  Tobacco Use   Smoking status: Never   Smokeless tobacco: Never  Vaping Use   Vaping Use: Never used  Substance and Sexual Activity   Alcohol use: Yes    Comment: rarely    Drug use: No   Sexual activity: Yes  Other Topics Concern   Not on file  Social History Narrative   Not on file   Social Determinants of Health   Financial Resource Strain: Not on file  Food Insecurity: Not on file  Transportation Needs: Not on file  Physical Activity: Not on file  Stress: Not on file  Social Connections: Not on file        Objective:  Physical Exam: BP 125/79   Pulse 74   Temp (!) 97.1 F (36.2 C) (Temporal)   Ht 6' (1.829 m)   Wt 232 lb (105.2 kg)   SpO2 97%   BMI 31.46 kg/m   Body mass index is 31.46 kg/m. Wt Readings from Last 3 Encounters:  12/09/22 232 lb (105.2 kg)  11/29/22 233 lb (105.7 kg)  09/21/22 241 lb (109.3 kg)   Gen: NAD, resting comfortably HEENT: TMs normal bilaterally. OP clear. No thyromegaly noted.  CV: RRR with no murmurs appreciated Pulm: NWOB, CTAB with no crackles, wheezes, or rhonchi GI: Normal bowel sounds present. Soft, Nontender, Nondistended. MSK: no edema, cyanosis, or clubbing noted Skin: warm, dry Neuro: CN2-12 grossly  intact. Strength 5/5 in upper and lower extremities. Reflexes symmetric and intact bilaterally.  Psych: Normal affect and thought content     Edwing Figley M. Jerline Pain, MD 12/09/2022 9:07 AM

## 2022-12-09 NOTE — Assessment & Plan Note (Signed)
Check A1c. 

## 2022-12-09 NOTE — Assessment & Plan Note (Signed)
Follows with pulmonology.  Does use Flovent as needed seasonally typically in the fall but overall symptoms been stable.  Reassuring exam today.

## 2022-12-09 NOTE — Assessment & Plan Note (Signed)
No red flags.  Discussed treatment options with patient.  Will be checking testosterone per patient request.  May have psychogenic ED.  We will start Cialis 10 mg daily.  We discussed potential side effects.  He will follow-up in a few weeks via MyChart

## 2022-12-09 NOTE — Assessment & Plan Note (Signed)
Following with sports medicine.   

## 2022-12-09 NOTE — Patient Instructions (Signed)
It was very nice to see you today!  We will check blood work today.  Please try the Cialis.  Let me know how this is working for you in a few weeks.  Keep up the great work your diet and exercise.  Will see back in a year for your next physical.  Come back to see [place] sooner if needed.  Take care, Dr Jerline Pain  PLEASE NOTE:  If you had any lab tests, please let us know if you have not heard back within a few days. You may see your results on mychart before we have a chance to review them but we will give you a call once they are reviewed by [place].   If we ordered any referrals today, please let us know if you have not heard from their office within the next week.   If you had any urgent prescriptions sent in today, please check with the pharmacy within an hour of our visit to make sure the prescription was transmitted appropriately.   Please try these tips to maintain a healthy lifestyle:  Eat at least 3 REAL meals and 1-2 snacks per day.  Aim for no more than 5 hours between eating.  If you eat breakfast, please do so within one hour of getting up.   Each meal should contain half fruits/vegetables, one quarter protein, and one quarter carbs (no bigger than a computer mouse)  Cut down on sweet beverages. This includes juice, soda, and sweet tea.   Drink at least 1 glass of water with each meal and aim for at least 8 glasses per day  Exercise at least 150 minutes every week.    Preventive Care 5-57 Years Old, Male Preventive care refers to lifestyle choices and visits with your health care provider that can promote health and wellness. Preventive care visits are also called wellness exams. What can I expect for my preventive care visit? Counseling During your preventive care visit, your health care provider may ask about your: Medical history, including: Past medical problems. Family medical history. Current health, including: Emotional well-being. Home life and relationship  well-being. Sexual activity. Lifestyle, including: Alcohol, nicotine or tobacco, and drug use. Access to firearms. Diet, exercise, and sleep habits. Safety issues such as seatbelt and bike helmet use. Sunscreen use. Work and work Statistician. Physical exam Your health care provider will check your: Height and weight. These may be used to calculate your BMI (body mass index). BMI is a measurement that tells if you are at a healthy weight. Waist circumference. This measures the distance around your waistline. This measurement also tells if you are at a healthy weight and may help predict your risk of certain diseases, such as type 2 diabetes and high blood pressure. Heart rate and blood pressure. Body temperature. Skin for abnormal spots. What immunizations do I need?  Vaccines are usually given at various ages, according to a schedule. Your health care provider will recommend vaccines for you based on your age, medical history, and lifestyle or other factors, such as travel or where you work. What tests do I need? Screening Your health care provider may recommend screening tests for certain conditions. This may include: Lipid and cholesterol levels. Diabetes screening. This is done by checking your blood sugar (glucose) after you have not eaten for a while (fasting). Hepatitis B test. Hepatitis C test. HIV (human immunodeficiency virus) test. STI (sexually transmitted infection) testing, if you are at risk. Lung cancer screening. Prostate cancer screening. Colorectal cancer  screening. Talk with your health care provider about your test results, treatment options, and if necessary, the need for more tests. Follow these instructions at home: Eating and drinking  Eat a diet that includes fresh fruits and vegetables, whole grains, lean protein, and low-fat dairy products. Take vitamin and mineral supplements as recommended by your health care provider. Do not drink alcohol if your  health care provider tells you not to drink. If you drink alcohol: Limit how much you have to 0-2 drinks a day. Know how much alcohol is in your drink. In the [place] one drink equals one 12 oz bottle of beer (355 mL), one 5 oz glass of wine (148 mL), or one 1 oz glass of hard liquor (44 mL). Lifestyle Brush your teeth every morning and night with fluoride toothpaste. Floss one time each day. Exercise for at least 30 minutes 5 or more days each week. Do not use any products that contain nicotine or tobacco. These products include cigarettes, chewing tobacco, and vaping devices, such as e-cigarettes. If you need help quitting, ask your health care provider. Do not use drugs. If you are sexually active, practice safe sex. Use a condom or other form of protection to prevent STIs. Take aspirin only as told by your health care provider. Make sure that you understand how much to take and what form to take. Work with your health care provider to find out whether it is safe and beneficial for you to take aspirin daily. Find healthy ways to manage stress, such as: Meditation, yoga, or listening to music. Journaling. Talking to a trusted person. Spending time with friends and family. Minimize exposure to UV radiation to reduce your risk of skin cancer. Safety Always wear your seat belt while driving or riding in a vehicle. Do not drive: If you have been drinking alcohol. Do not ride with someone who has been drinking. When you are tired or distracted. While texting. If you have been using any mind-altering substances or drugs. Wear a helmet and other protective equipment during sports activities. If you have firearms in your house, make sure you follow all gun safety procedures. What's next? Go to your health care provider once a year for an annual wellness visit. Ask your health care provider how often you should have your eyes and teeth checked. Stay up to date on all vaccines. This information  is not intended to replace advice given to you by your health care provider. Make sure you discuss any questions you have with your health care provider. Document Revised: 04/15/2021 Document Reviewed: 04/15/2021 Elsevier Patient Education  [place].

## 2022-12-09 NOTE — Assessment & Plan Note (Signed)
Following with medical weight management.  Now on Zepbound 5mg  weekly.  Seems to be doing well at this dose.

## 2022-12-10 LAB — HEPATITIS C ANTIBODY: Hepatitis C Ab: NONREACTIVE

## 2022-12-10 NOTE — Progress Notes (Signed)
Please inform patient of the following:  His cholesterol level is about the same as the last several years.  He is borderline elevated.  We do not need to start meds but he should continue to work on diet and exercise.  All of his other labs are normal and we can recheck in a year.  Robert [name]. Jerline Pain, MD 12/10/2022 2:59 PM

## 2022-12-13 ENCOUNTER — Other Ambulatory Visit (HOSPITAL_COMMUNITY): Payer: Self-pay

## 2022-12-13 ENCOUNTER — Ambulatory Visit
Admission: RE | Admit: 2022-12-13 | Discharge: 2022-12-13 | Disposition: A | Discharge status: Home / Self Care | Payer: BC Managed Care – PPO | Source: Ambulatory Visit | Attending: Sports Medicine | Admitting: Sports Medicine

## 2022-12-13 DIAGNOSIS — M47817 Spondylosis without myelopathy or radiculopathy, lumbosacral region: Secondary | ICD-10-CM | POA: Diagnosis not present

## 2022-12-13 DIAGNOSIS — G8929 Other chronic pain: Secondary | ICD-10-CM

## 2022-12-13 DIAGNOSIS — M5136 Other intervertebral disc degeneration, lumbar region: Secondary | ICD-10-CM

## 2022-12-13 DIAGNOSIS — M47816 Spondylosis without myelopathy or radiculopathy, lumbar region: Secondary | ICD-10-CM

## 2022-12-13 MED ORDER — IOPAMIDOL (ISOVUE-M 200) INJECTION 41%
1.0000 mL | Freq: Once | INTRAMUSCULAR | Status: AC
  Administered 2022-12-13: 1 mL via EPIDURAL

## 2022-12-13 MED ORDER — METHYLPREDNISOLONE ACETATE 40 MG/ML INJ SUSP (RADIOLOG
80.0000 mg | Freq: Once | INTRAMUSCULAR | Status: AC
  Administered 2022-12-13: 80 mg via EPIDURAL

## 2022-12-13 NOTE — Telephone Encounter (Signed)
Routing to Dr. Erin Fulling for review.

## 2022-12-13 NOTE — Telephone Encounter (Signed)
Per test claim of the Flovent [facility] generic here are the preferred alternatives.

## 2022-12-13 NOTE — Discharge Instructions (Signed)

## 2022-12-14 DIAGNOSIS — E88819 Insulin resistance, unspecified: Secondary | ICD-10-CM | POA: Diagnosis not present

## 2022-12-14 DIAGNOSIS — E785 Hyperlipidemia, unspecified: Secondary | ICD-10-CM | POA: Diagnosis not present

## 2022-12-14 DIAGNOSIS — E559 Vitamin D deficiency, unspecified: Secondary | ICD-10-CM | POA: Diagnosis not present

## 2022-12-21 NOTE — Telephone Encounter (Signed)
Please check with pharmacy team which ICS/LABA inhalers are covered.   Thanks, JD

## 2022-12-27 ENCOUNTER — Other Ambulatory Visit (HOSPITAL_COMMUNITY): Payer: Self-pay

## 2022-12-27 DIAGNOSIS — D2262 Melanocytic nevi of left upper limb, including shoulder: Secondary | ICD-10-CM | POA: Diagnosis not present

## 2022-12-27 DIAGNOSIS — D225 Melanocytic nevi of trunk: Secondary | ICD-10-CM | POA: Diagnosis not present

## 2022-12-27 DIAGNOSIS — D2261 Melanocytic nevi of right upper limb, including shoulder: Secondary | ICD-10-CM | POA: Diagnosis not present

## 2022-12-27 DIAGNOSIS — L821 Other seborrheic keratosis: Secondary | ICD-10-CM | POA: Diagnosis not present

## 2022-12-27 NOTE — Telephone Encounter (Signed)
Please let patient know the following from our pharmacy team regarding his inhaler options.   Patient seemingly has a deductible/initial benefit phase to get through causing higher co-pays. Per test claims Stan Head and Advair HFA are $121.03 and Brand Symbicort, generic Advair Diskus $111.03   Flovent is not being manufactured any more and his insurance does not cover other inhaled steroid only inhalers.   Recommend transitioning to dulera or Advair HFA if he would like to stick with similar inhaler function as Flovent. These inhalers have steroid plus a long acting albuterol medication.  Thanks, JD

## 2022-12-27 NOTE — Telephone Encounter (Signed)
Patient seemingly has a deductible/initial benefit phase to get through causing higher co-pays.  Per test claims Stan Head and Advair HFA are $121.03 and Brand Symbicort, generic Advair Diskus $111.03

## 2022-12-28 NOTE — Telephone Encounter (Signed)
Sent pt a mychart message. 

## 2023-01-17 DIAGNOSIS — E88819 Insulin resistance, unspecified: Secondary | ICD-10-CM | POA: Diagnosis not present

## 2023-01-17 DIAGNOSIS — E785 Hyperlipidemia, unspecified: Secondary | ICD-10-CM | POA: Diagnosis not present

## 2023-01-17 DIAGNOSIS — E559 Vitamin D deficiency, unspecified: Secondary | ICD-10-CM | POA: Diagnosis not present

## 2023-02-21 DIAGNOSIS — E785 Hyperlipidemia, unspecified: Secondary | ICD-10-CM | POA: Diagnosis not present

## 2023-02-21 DIAGNOSIS — E559 Vitamin D deficiency, unspecified: Secondary | ICD-10-CM | POA: Diagnosis not present

## 2023-02-21 DIAGNOSIS — E88819 Insulin resistance, unspecified: Secondary | ICD-10-CM | POA: Diagnosis not present

## 2023-03-08 ENCOUNTER — Other Ambulatory Visit (HOSPITAL_BASED_OUTPATIENT_CLINIC_OR_DEPARTMENT_OTHER): Payer: Self-pay

## 2023-03-08 MED ORDER — ZEPBOUND 2.5 MG/0.5ML ~~LOC~~ SOAJ
SUBCUTANEOUS | 0 refills | Status: DC
  Filled 2023-03-08 – 2023-03-09 (×2): qty 2, 28d supply, fill #0

## 2023-03-09 ENCOUNTER — Other Ambulatory Visit (HOSPITAL_BASED_OUTPATIENT_CLINIC_OR_DEPARTMENT_OTHER): Payer: Self-pay

## 2023-03-18 NOTE — Progress Notes (Unsigned)
Robert [name] [address] [place] [address] Phone: [phone]   Assessment and Plan:     There are no diagnoses linked to this encounter.  ***   Pertinent previous records reviewed include ***   Follow Up: ***     Subjective:   I, Robert [name], am serving as a Neurosurgeon for Doctor Richardean Sale   Chief Complaint: low back pain    HPI:    06/15/2022 Patient is a 52 year old male complaining of low back pain. Patient states This has been a recurring issue for multiple years Usually located in bilateral lower back and then somestimes gets numbness in both of his legs. In the past he has seen a chiropractor and orthopedist for this.  Unfortunately, symptoms have gotten much worse the last few months. The pain in his back is sometimes alleviated by putting pressure in the area. He also has some pain in his right gluteus muscle groups. Pain is only noticeable when he is inactive. Pain is much worse in the morning. He has had xrays in the past but has not had any MRI. 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  Patient was unable to tolerate Flexeril due to "hangover feeling" the next morning   08/19/2022 Patient states that he is the same had a little flare last night no ain just felt weird   09/21/2022 Patient states he is okay , been 4 1/2 weeks most of the time 90%  success ,did have a flare last night he was moving christmas boxes and had a sharpe pain that triggered a prolonged pain that  treated with ib , wants to discuss exercises     11/29/2022 Patient states he has been being good , last week started creeping back only when he is sedentary , if he keeps it warm he is good    03/21/2023 Patient states  Relevant Historical Information:  none pertinent   Additional pertinent review of systems negative.   Current Outpatient Medications:    albuterol (VENTOLIN HFA) 108 (90 Base) MCG/ACT inhaler, Inhale 2 puffs into the lungs every 6 (six) hours as needed for wheezing or shortness of breath. (Patient not taking: Reported on 12/09/2022), Disp: 8 g, Rfl: 6   fluticasone (FLOVENT HFA) 110 MCG/ACT inhaler, Inhale 2 puffs into the lungs 2 (two) times daily as needed. (Patient not taking: Reported on 12/09/2022), Disp: 36 g, Rfl: 3   tadalafil (CIALIS) 10 MG tablet, Take 1 tablet (10 mg total) by mouth every other day as needed for erectile dysfunction., Disp: 10 tablet, Rfl: 1   tirzepatide (ZEPBOUND) 2.5 MG/0.5ML Pen, Inject 2.5 mg under skin once a week 30 days., Disp: 2 mL, Rfl: 0   tirzepatide (ZEPBOUND) 5 MG/0.5ML Pen, Inject 5 mg into  the skin once a week., Disp: , Rfl:    Objective:     There were no vitals filed for this visit.    There is no height or weight on file to calculate BMI.    Physical Exam:    ***   Electronically signed by:  Robert [name] 10:04 AM 03/18/23

## 2023-03-21 ENCOUNTER — Ambulatory Visit: Payer: BC Managed Care – PPO | Admitting: Sports Medicine

## 2023-03-21 VITALS — HR 94 | Ht 72.0 in | Wt 234.0 lb

## 2023-03-21 DIAGNOSIS — G8929 Other chronic pain: Secondary | ICD-10-CM

## 2023-03-21 DIAGNOSIS — M47816 Spondylosis without myelopathy or radiculopathy, lumbar region: Secondary | ICD-10-CM | POA: Diagnosis not present

## 2023-03-21 DIAGNOSIS — M5441 Lumbago with sciatica, right side: Secondary | ICD-10-CM

## 2023-03-21 NOTE — Patient Instructions (Addendum)
Good to see you Epidural right L5-S1 Follow up 2 weeks after to discuss results 

## 2023-04-04 ENCOUNTER — Other Ambulatory Visit (HOSPITAL_COMMUNITY): Payer: Self-pay

## 2023-04-04 DIAGNOSIS — E559 Vitamin D deficiency, unspecified: Secondary | ICD-10-CM | POA: Diagnosis not present

## 2023-04-04 DIAGNOSIS — R739 Hyperglycemia, unspecified: Secondary | ICD-10-CM | POA: Diagnosis not present

## 2023-04-04 DIAGNOSIS — E785 Hyperlipidemia, unspecified: Secondary | ICD-10-CM | POA: Diagnosis not present

## 2023-04-04 DIAGNOSIS — E88819 Insulin resistance, unspecified: Secondary | ICD-10-CM | POA: Diagnosis not present

## 2023-04-04 MED ORDER — ZEPBOUND 10 MG/0.5ML ~~LOC~~ SOAJ
10.0000 mg | SUBCUTANEOUS | 1 refills | Status: DC
  Filled 2023-04-04 – 2023-04-20 (×3): qty 2, 28d supply, fill #0

## 2023-04-05 ENCOUNTER — Other Ambulatory Visit: Payer: Self-pay | Admitting: Sports Medicine

## 2023-04-05 ENCOUNTER — Other Ambulatory Visit (HOSPITAL_COMMUNITY): Payer: Self-pay

## 2023-04-05 ENCOUNTER — Ambulatory Visit
Admission: RE | Admit: 2023-04-05 | Discharge: 2023-04-05 | Disposition: A | Discharge status: Home / Self Care | Payer: BC Managed Care – PPO | Source: Ambulatory Visit | Attending: Sports Medicine | Admitting: Sports Medicine

## 2023-04-05 DIAGNOSIS — M47816 Spondylosis without myelopathy or radiculopathy, lumbar region: Secondary | ICD-10-CM

## 2023-04-05 DIAGNOSIS — G8929 Other chronic pain: Secondary | ICD-10-CM

## 2023-04-05 DIAGNOSIS — M545 Low back pain, unspecified: Secondary | ICD-10-CM | POA: Diagnosis not present

## 2023-04-05 MED ORDER — IOPAMIDOL (ISOVUE-M 200) INJECTION 41%
1.0000 mL | Freq: Once | INTRAMUSCULAR | Status: AC
  Administered 2023-04-05: 1 mL via EPIDURAL

## 2023-04-05 MED ORDER — METHYLPREDNISOLONE ACETATE 40 MG/ML INJ SUSP (RADIOLOG
120.0000 mg | Freq: Once | INTRAMUSCULAR | Status: AC
  Administered 2023-04-05: 120 mg via INTRA_ARTICULAR

## 2023-04-05 NOTE — Discharge Instructions (Signed)

## 2023-04-20 ENCOUNTER — Other Ambulatory Visit (HOSPITAL_COMMUNITY): Payer: Self-pay

## 2023-04-21 ENCOUNTER — Ambulatory Visit: Payer: BC Managed Care – PPO | Admitting: Sports Medicine

## 2023-04-21 ENCOUNTER — Other Ambulatory Visit (HOSPITAL_COMMUNITY): Payer: Self-pay

## 2023-04-21 VITALS — BP 110/80 | HR 81 | Ht 72.0 in | Wt 217.0 lb

## 2023-04-21 DIAGNOSIS — G8929 Other chronic pain: Secondary | ICD-10-CM | POA: Diagnosis not present

## 2023-04-21 DIAGNOSIS — M5136 Other intervertebral disc degeneration, lumbar region: Secondary | ICD-10-CM | POA: Diagnosis not present

## 2023-04-21 DIAGNOSIS — M47816 Spondylosis without myelopathy or radiculopathy, lumbar region: Secondary | ICD-10-CM | POA: Diagnosis not present

## 2023-04-21 DIAGNOSIS — M5441 Lumbago with sciatica, right side: Secondary | ICD-10-CM | POA: Diagnosis not present

## 2023-04-21 NOTE — Progress Notes (Signed)
[name] [address] [place] [address] Phone: [phone]   Assessment and Plan:     1. Chronic bilateral low back pain with right-sided sciatica 2. Lumbar facet arthropathy 3. DDD (degenerative disc disease), lumbar -Chronic with exacerbation, subsequent sports medicine visit - Patient has had improvement in low back pain, especially right-sided low back pain after bilateral L5-S1 facet injections performed on 04/05/2023.  He continues to have intermittent low back pain, now presenting as a mild to moderate left-sided pain that occurs with low back flexion. - In the past, patient had relief after epidural CSI's performed on 08/23/2022 and 09/27/2022, however patient felt essentially no change after epidural CSI on 12/13/2022.  Facet CSI's provided some relief especially to right-sided pain, though patient's symptoms have changed to left-sided pain - Recommend starting physical therapy and continue HEP - Will refer patient to neurosurgery for further evaluation to see if there are other conservative treatments that patient could trial  Pertinent previous records reviewed include facet CSI procedure note 04/05/2023   Follow Up: 4 to 6 weeks for reevaluation.  Would see if physical therapy has been beneficial.  We discussed that patient has established with neurosurgery   Subjective:   I, Jerene Canny, am serving as a Neurosurgeon for Doctor Richardean Sale   Chief Complaint: low back pain    HPI:    06/15/2022 Patient is a 52 year old male complaining of low back pain. Patient states This has been a recurring issue for multiple years Usually located in bilateral lower back and then somestimes gets numbness in both of his legs. In the past he has seen a chiropractor and orthopedist for this.  Unfortunately, symptoms have gotten much worse the last few months. The pain in his back is sometimes alleviated by putting pressure in the  area. He also has some pain in his right gluteus muscle groups. Pain is only noticeable when he is inactive. Pain is much worse in the morning. He has had xrays in the past but has not had any MRI. 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  Patient was unable to tolerate Flexeril due to "hangover feeling" the next morning   08/19/2022 Patient states that he is the same had a little flare last night no ain just felt weird   09/21/2022 Patient states he is okay , been 4 1/2 weeks most of the time 90% success ,did have a flare last night he was moving christmas boxes and had a sharpe pain that triggered a prolonged pain that  treated with ib , wants to discuss exercises     11/29/2022 Patient states he has been being good , last week started creeping back only when he is sedentary , if he keeps it warm he is  good    03/21/2023 Patient states that back pain is back , last epidural didn't take pain was back in a week, wants to know if the next steps are surgery or another epidural . Core workouts help but they dont last    04/21/2023 Patient states the pain is different , in general he is having less moments of pain than before , no more dull persistent pain.  he has been experiencing flare ups, when he is standing around he has the most pain, no pain with exercise but when he was walking though [employer] he had pain, also when he unloaded the  dishwasher . He is now starting to feel some left sided glute pain. Wants to know what the next steps would be      Relevant Historical Information:  none pertinent     Additional pertinent review of systems negative.   Current Outpatient Medications:    albuterol (VENTOLIN HFA) 108 (90 Base) MCG/ACT inhaler, Inhale 2 puffs into the lungs every 6 (six) hours as needed for wheezing or shortness of breath. (Patient not taking: Reported on 12/09/2022), Disp: 8 g, Rfl: 6   fluticasone (FLOVENT HFA) 110 MCG/ACT inhaler, Inhale 2 puffs into the lungs 2 (two) times daily as needed. (Patient not taking: Reported on 12/09/2022), Disp: 36 g, Rfl: 3   tadalafil (CIALIS) 10 MG tablet, Take 1 tablet (10 mg total) by mouth every other day as needed for erectile dysfunction., Disp: 10 tablet, Rfl: 1   tirzepatide (ZEPBOUND) 10 MG/0.5ML Pen, Inject 10 mg into the skin once a week., Disp: 2 mL, Rfl: 1   tirzepatide (ZEPBOUND) 2.5 MG/0.5ML Pen, Inject 2.5 mg under skin once a week 30 days., Disp: 2 mL, Rfl: 0   tirzepatide (ZEPBOUND) 5 MG/0.5ML Pen, Inject 5 mg into the skin once a week., Disp: , Rfl:    Objective:     Vitals:   04/21/23 0920  BP: 110/80  Pulse: 81  SpO2: 97%  Weight: 217 lb (98.4 kg)  Height: 6' (1.829 m)      Body mass index is 29.43 kg/m.    Physical Exam:     Gen: Appears well, nad, nontoxic and pleasant Psych: Alert and oriented, appropriate mood and affect Neuro: sensation intact, strength is 5/5 in upper and lower extremities, muscle tone wnl Skin: no susupicious lesions or rashes  Back - Normal skin, Spine with normal alignment and no deformity.   No tenderness to vertebral process palpation.     bilateral lumbar paraspinous muscles are mildly tender and without spasm NTTP gluteal musculature, SI joints Straight leg raise negative Trendelenberg negative Piriformis Test negative  Central to left low back pain with lumbar flexion  Electronically signed by:  Robert [name] 9:58 AM 04/21/23

## 2023-04-21 NOTE — Patient Instructions (Signed)
4-6 week follow up  PT referral Neurosurgery referral

## 2023-04-25 ENCOUNTER — Other Ambulatory Visit (HOSPITAL_BASED_OUTPATIENT_CLINIC_OR_DEPARTMENT_OTHER): Payer: Self-pay

## 2023-04-25 MED ORDER — ZEPBOUND 10 MG/0.5ML ~~LOC~~ SOAJ
10.0000 mg | SUBCUTANEOUS | 1 refills | Status: DC
  Filled 2023-05-30 – 2023-06-10 (×2): qty 2, 28d supply, fill #0
  Filled 2023-06-26 – 2023-07-05 (×2): qty 2, 28d supply, fill #1

## 2023-04-25 MED ORDER — ZEPBOUND 7.5 MG/0.5ML ~~LOC~~ SOAJ
7.5000 mg | SUBCUTANEOUS | 0 refills | Status: DC
  Filled 2023-04-25: qty 2, 28d supply, fill #0

## 2023-04-28 DIAGNOSIS — M5440 Lumbago with sciatica, unspecified side: Secondary | ICD-10-CM | POA: Diagnosis not present

## 2023-04-28 DIAGNOSIS — Z6830 Body mass index (BMI) 30.0-30.9, adult: Secondary | ICD-10-CM | POA: Diagnosis not present

## 2023-05-04 DIAGNOSIS — F4323 Adjustment disorder with mixed anxiety and depressed mood: Secondary | ICD-10-CM | POA: Diagnosis not present

## 2023-05-16 DIAGNOSIS — E88819 Insulin resistance, unspecified: Secondary | ICD-10-CM | POA: Diagnosis not present

## 2023-05-16 DIAGNOSIS — E785 Hyperlipidemia, unspecified: Secondary | ICD-10-CM | POA: Diagnosis not present

## 2023-05-16 DIAGNOSIS — E559 Vitamin D deficiency, unspecified: Secondary | ICD-10-CM | POA: Diagnosis not present

## 2023-05-16 DIAGNOSIS — E663 Overweight: Secondary | ICD-10-CM | POA: Diagnosis not present

## 2023-05-16 DIAGNOSIS — Z9189 Other specified personal risk factors, not elsewhere classified: Secondary | ICD-10-CM | POA: Diagnosis not present

## 2023-05-17 NOTE — Therapy (Signed)
OUTPATIENT PHYSICAL THERAPY THORACOLUMBAR EVALUATION   Patient Name: Robert [name] MRN: [id number] DOB:24-Jan-1971, 52 y.o., male Today's Date: 05/20/2023  END OF SESSION:  PT End of Session - 05/20/23 0835     Visit Number 1    Number of Visits 9    Date for PT Re-Evaluation 07/22/23    Authorization Type [employer] COMM PPO    PT Start Time 0720    PT Stop Time 0805    PT Time Calculation (min) 45 min    Activity Tolerance Patient tolerated treatment well    Behavior During Therapy [facility] for tasks assessed/performed             Past Medical History:  Diagnosis Date   Asthma dx aprox 2002   exercise induced , rarely has sx    Seasonal allergies    Past Surgical History:  Procedure Laterality Date   VASECTOMY  2013   Patient Active Problem List   Diagnosis Date Noted   Erectile dysfunction 12/09/2022   Obesity 11/06/2021   Chronic cough 01/12/2021   Insomnia 06/12/2019   Dyslipidemia 05/09/2018   Hyperglycemia 05/09/2018   Chronic low back pain with sciatica 05/09/2018    PCP: Ardith Dark, MD   REFERRING PROVIDER: Richardean Sale, DO   REFERRING DIAG: [phone] (ICD-10-CM) - Chronic bilateral low back pain with right-sided sciatica, M47.816 (ICD-10-CM) - Lumbar facet arthropathy, M51.36 (ICD-10-CM) - DDD (degenerative disc disease), lumbar.   Rationale for Evaluation and Treatment: Rehabilitation  THERAPY DIAG:  Chronic bilateral low back pain with right-sided sciatica  Muscle weakness (generalized)  Difficulty in walking, not elsewhere classified  ONSET DATE: Approx 1 year ago  SUBJECTIVE:                                                                                                                                                                                           SUBJECTIVE STATEMENT: Pt reports a chronic Hx of low back with radiating pian to his R gluteal and post/lat R thigh.  Pt notes he has received 4 epidurals, over the last  year, to address the pain. The last one about 1 month ago providing good relief. Pt reports he can ride a peloton exercise bike without worsening symptoms.  PERTINENT HISTORY:  NA  PAIN:  Are you having pain? Yes: NPRS scale: 0-2/10 Pain location: Low back radiating to gluteal and post/lat thigh Pain description: ache Aggravating factors: prolonged sitting and walking Relieving factors: In standing with A/P and lateral hip rocking  PRECAUTIONS: None  RED FLAGS: None   WEIGHT BEARING RESTRICTIONS: No  FALLS:  Has patient fallen in last 6  months? 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 L5-S1: Bilateral facet arthropathy with gaping fluid-filled joints. Anterolisthesis of 3 mm which would likely worsen with standing or flexion. Bulging of the disc. Mild stenosis of the lateral recesses and foramina which could worsen with standing or flexion. Findings at this level could relate to back pain, referred facet syndrome pain or potentially neural irritation. 2. L4-5: Small facet joint effusions. No stenosis.  PATIENT SURVEYS:  Not completed due to internet issues. Will administer on next appointment  SCREENING FOR RED FLAGS: Bowel or bladder incontinence: No Spinal tumors: No Cauda equina syndrome: No Compression fracture: No  COGNITION: Overall cognitive status: Within functional limits for tasks assessed     SENSATION: Pt reports intermittent   MUSCLE LENGTH: Hamstrings: Right ight deg; Left WNLs deg Maisie Fus test: Right NT deg; Left NT deg  POSTURE: rounded  shoulders and forward head  PALPATION: TTP to the R L3-L5 paraspinals   LUMBAR ROM:   AROM eval  Flexion Full, pulling discomfort low back  Extension Min limitation, no pain  Right lateral flexion Min limitation, pressure discomfort  Left lateral flexion Full  Right rotation Full  Left rotation Full   (Blank rows = not tested)  LOWER EXTREMITY ROM:      Grosssly WNLs Active  Right eval Left eval  Hip flexion    Hip extension    Hip abduction    Hip adduction    Hip internal rotation    Hip external rotation    Knee flexion    Knee extension    Ankle dorsiflexion    Ankle plantarflexion    Ankle inversion    Ankle eversion     (Blank rows = not tested)  LOWER EXTREMITY MMT:    Weak core- forward plank 30" MMT Right eval Left eval  Hip flexion 5 5  Hip extension 4 4  Hip abduction 5 5  Hip adduction 5 5  Hip internal rotation 5 5  Hip external rotation 5 5  Knee flexion 5 5  Knee extension 5 5  Ankle dorsiflexion    Ankle plantarflexion    Ankle inversion    Ankle eversion     (Blank rows = not tested)  LUMBAR SPECIAL TESTS:  Straight leg raise test: Negative, Slump test: Negative, SI Compression/distraction test: Negative, and FABER test: Negative  FUNCTIONAL TESTS:  NT  GAIT: Distance walked: 200' Assistive device utilized: None Level of assistance: Complete Independence Comments: WNLs  TODAY'S TREATMENT:    [facility] Adult PT Treatment:                                                DATE: 05/20/23 Therapeutic Exercise: Developed, instructed in, and pt completed therex as noted in HEP Self Care: Standing trunk ext and/or standing pelvic tilts periodically during the day to interrupt periods of prolonged sitting. Use of standing desk  PATIENT EDUCATION:  Education details: Eval findings, POC, HEP, self care Person educated:  Patient Education method: Explanation, Demonstration, Tactile cues, Verbal cues, and Handouts Education comprehension: verbalized understanding, returned demonstration, verbal cues required, tactile cues required, and needs further education  HOME EXERCISE PROGRAM: Access Code: [email or web address] URL: [email or web address] Date: 05/20/2023 Prepared by: Joellyn Rued  Exercises - Prone Press Up  - 2 x daily - 7 x weekly - 1 sets - 10 reps - 3 hold - Standing Lumbar Extension  - 4 x daily - 7 x weekly - 1 sets - 10 reps - 2 hold - Supine Posterior Pelvic Tilt  - 1 x daily - 7 x weekly - 2 sets - 10 reps - 3 hold - Supine 90/90 Alternating Heel Touches with Posterior Pelvic Tilt  - 1 x daily - 7 x weekly - 2 sets - 10 reps - 3 hold - Standard Plank  - 1 x daily - 7 x weekly - 2 sets - 5 reps - 10 hold - Prone Hip Extension  - 1 x daily - 7 x weekly - 2 sets - 10 reps - 3 hold - Prone Alternating Arm and Leg Lifts  - 1 x daily - 7 x weekly - 2 sets - 10 reps - 3 hold  ASSESSMENT:  CLINICAL IMPRESSION: Patient is a 52 y.o. nale who was seen today for physical therapy evaluation and treatment for M54.41,G89.29 (ICD-10-CM) - Chronic bilateral low back pain with right-sided sciatica, M47.816 (ICD-10-CM) - Lumbar facet arthropathy, M51.36 (ICD-10-CM) - DDD (degenerative disc disease), lumbar. Pt presents today with his symptoms minimized after an epidural 1 month ago. With trunk assessment movements and lumbar special tests, concordant symptoms were not provoked. The R hamstring flexibility was decreased in comparison to the L, hip extension strength was decreased bilat, and core strength was decreased. A HEP was initiated with trunk flexibility therex c an extension bias and therex for core strengthening. Pt will benefit from skilled PT1w8 to address impairments to optimize back function.    OBJECTIVE IMPAIRMENTS: decreased activity tolerance, decreased ROM, decreased strength, and pain.    ACTIVITY LIMITATIONS: sitting, standing, and locomotion level  PARTICIPATION LIMITATIONS: cleaning, laundry, and occupation  PERSONAL FACTORS: Past/current experiences and Time since onset of injury/illness/exacerbation are also affecting patient's functional outcome.   REHAB POTENTIAL: Excellent  CLINICAL DECISION MAKING: Evolving/moderate complexity  EVALUATION COMPLEXITY: Moderate   GOALS:  SHORT TERM GOALS: Target date: 06/10/23  Pt will be Ind in an initial HEP Baseline: started Goal status: INITIAL  2.  Pt will voice understanding of measures to assist in pain reduction Baseline: started Goal status: INITIAL   LONG TERM GOALS: Target date: 07/22/23  Pt will be Ind in a final HEP to maintain achieved LOF  Baseline: started Goal status: INITIAL  2.  Increase R hamstring flexibility to within 90% of the L Baseline: R is tight Goal status: INITIAL  3.  Pt will demonstrate increase core strength completing a forward plank for 60" for improved trunk function and better managed R low back/LE pain Baseline: 30" Goal status: INITIAL  4.  Increase bilat hip extension strength to 4+/5 c MMT for improved trunk function better and managed R low back/LE pain Baseline:  Goal status: INITIAL  5.  Pt's FOTO score will improved to the predicted value as indication of improved function  Baseline: TBA Goal status: INITIAL  PLAN:  PT FREQUENCY: 1x/week  PT DURATION: 8 weeks  PLANNED INTERVENTIONS: Therapeutic exercises, Therapeutic activity, Patient/Family  education, Self Care, Joint mobilization, Aquatic Therapy, Dry Needling, Electrical stimulation, Spinal mobilization, Cryotherapy, Moist heat, Taping, Ultrasound, Ionotophoresis 4mg /ml Dexamethasone, Manual therapy, and Re-evaluation.  PLAN FOR NEXT SESSION: Review FOTO; assess response to HEP; progress therex as indicated; use of modalities, manual therapy; and TPDN as indicated.   Jerald Villalona MS, PT 05/20/23 9:57  PM

## 2023-05-20 ENCOUNTER — Other Ambulatory Visit: Payer: Self-pay

## 2023-05-20 ENCOUNTER — Ambulatory Visit: Payer: BC Managed Care – PPO | Attending: Sports Medicine

## 2023-05-20 DIAGNOSIS — M6281 Muscle weakness (generalized): Secondary | ICD-10-CM | POA: Insufficient documentation

## 2023-05-20 DIAGNOSIS — M5441 Lumbago with sciatica, right side: Secondary | ICD-10-CM | POA: Diagnosis not present

## 2023-05-20 DIAGNOSIS — G8929 Other chronic pain: Secondary | ICD-10-CM | POA: Diagnosis not present

## 2023-05-20 DIAGNOSIS — R262 Difficulty in walking, not elsewhere classified: Secondary | ICD-10-CM | POA: Diagnosis not present

## 2023-05-20 DIAGNOSIS — M47816 Spondylosis without myelopathy or radiculopathy, lumbar region: Secondary | ICD-10-CM | POA: Insufficient documentation

## 2023-05-20 DIAGNOSIS — M5136 Other intervertebral disc degeneration, lumbar region: Secondary | ICD-10-CM | POA: Diagnosis not present

## 2023-05-30 ENCOUNTER — Other Ambulatory Visit (HOSPITAL_BASED_OUTPATIENT_CLINIC_OR_DEPARTMENT_OTHER): Payer: Self-pay

## 2023-06-01 DIAGNOSIS — F4323 Adjustment disorder with mixed anxiety and depressed mood: Secondary | ICD-10-CM | POA: Diagnosis not present

## 2023-06-02 ENCOUNTER — Other Ambulatory Visit (HOSPITAL_COMMUNITY): Payer: Self-pay

## 2023-06-03 ENCOUNTER — Ambulatory Visit: Payer: BC Managed Care – PPO

## 2023-06-06 ENCOUNTER — Other Ambulatory Visit (HOSPITAL_BASED_OUTPATIENT_CLINIC_OR_DEPARTMENT_OTHER): Payer: Self-pay

## 2023-06-10 ENCOUNTER — Other Ambulatory Visit (HOSPITAL_BASED_OUTPATIENT_CLINIC_OR_DEPARTMENT_OTHER): Payer: Self-pay

## 2023-06-20 ENCOUNTER — Other Ambulatory Visit (HOSPITAL_BASED_OUTPATIENT_CLINIC_OR_DEPARTMENT_OTHER): Payer: Self-pay

## 2023-06-20 DIAGNOSIS — E559 Vitamin D deficiency, unspecified: Secondary | ICD-10-CM | POA: Diagnosis not present

## 2023-06-20 DIAGNOSIS — E785 Hyperlipidemia, unspecified: Secondary | ICD-10-CM | POA: Diagnosis not present

## 2023-06-20 DIAGNOSIS — E663 Overweight: Secondary | ICD-10-CM | POA: Diagnosis not present

## 2023-06-20 DIAGNOSIS — E88819 Insulin resistance, unspecified: Secondary | ICD-10-CM | POA: Diagnosis not present

## 2023-06-20 MED ORDER — ZEPBOUND 10 MG/0.5ML ~~LOC~~ SOAJ
10.0000 mg | SUBCUTANEOUS | 1 refills | Status: DC
  Filled 2023-06-20: qty 2, 30d supply, fill #0
  Filled 2023-06-25 – 2023-08-01 (×2): qty 2, 28d supply, fill #0

## 2023-06-21 ENCOUNTER — Encounter: Payer: Self-pay | Admitting: Sports Medicine

## 2023-06-21 DIAGNOSIS — M47816 Spondylosis without myelopathy or radiculopathy, lumbar region: Secondary | ICD-10-CM

## 2023-06-21 DIAGNOSIS — G8929 Other chronic pain: Secondary | ICD-10-CM

## 2023-06-21 DIAGNOSIS — M51369 Other intervertebral disc degeneration, lumbar region without mention of lumbar back pain or lower extremity pain: Secondary | ICD-10-CM

## 2023-06-21 DIAGNOSIS — M5136 Other intervertebral disc degeneration, lumbar region: Secondary | ICD-10-CM

## 2023-06-25 ENCOUNTER — Other Ambulatory Visit (HOSPITAL_BASED_OUTPATIENT_CLINIC_OR_DEPARTMENT_OTHER): Payer: Self-pay

## 2023-06-27 ENCOUNTER — Other Ambulatory Visit (HOSPITAL_BASED_OUTPATIENT_CLINIC_OR_DEPARTMENT_OTHER): Payer: Self-pay

## 2023-07-05 ENCOUNTER — Other Ambulatory Visit (HOSPITAL_BASED_OUTPATIENT_CLINIC_OR_DEPARTMENT_OTHER): Payer: Self-pay

## 2023-07-06 NOTE — Progress Notes (Unsigned)
Robert [name] D.[name] [address] [place] [address] Phone: [phone]   Assessment and Plan:     There are no diagnoses linked to this encounter.  ***   Pertinent previous records reviewed include ***   Follow Up: ***     Subjective:   I, Robert [name], am serving as a Neurosurgeon for Doctor Richardean Sale   Chief Complaint: low back pain    HPI:    06/15/2022 Patient is a 52 year old male complaining of low back pain. Patient states This has been a recurring issue for multiple years Usually located in bilateral lower back and then somestimes gets numbness in both of his legs. In the past he has seen a chiropractor and orthopedist for this.  Unfortunately, symptoms have gotten much worse the last few months. The pain in his back is sometimes alleviated by putting pressure in the area. He also has some pain in his right gluteus muscle groups. Pain is only noticeable when he is inactive. Pain is much worse in the morning. He has had xrays in the past but has not had any MRI. 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  Patient was unable to tolerate Flexeril due to "hangover feeling" the next morning   08/19/2022 Patient states that he is the same had a little flare last night no ain just felt weird   09/21/2022 Patient states he is okay , been 4 1/2 weeks most of the time 90%  success ,did have a flare last night he was moving christmas boxes and had a sharpe pain that triggered a prolonged pain that  treated with ib , wants to discuss exercises     11/29/2022 Patient states he has been being good , last week started creeping back only when he is sedentary , if he keeps it warm he is good    03/21/2023 Patient states that back pain is back , last epidural didn't take pain was back in a week, wants to know if the next steps are surgery or another epidural . Core workouts help but they dont last    04/21/2023 Patient states the pain is different , in general he is having less moments of pain than before , no more dull persistent pain.   he has been experiencing flare ups, when he is standing around he has the most pain, no pain with exercise but when he was walking though [employer] he had pain, also when he unloaded the dishwasher . He is now starting to feel some left sided glute pain. Wants to know what the next steps would be     07/07/2023 Patient states    Relevant Historical Information:  none pertinent  Additional pertinent review of systems negative.   Current Outpatient Medications:  albuterol (VENTOLIN HFA) 108 (90 Base) MCG/ACT inhaler, Inhale 2 puffs into the lungs every 6 (six) hours as needed for wheezing or shortness of breath. (Patient not taking: Reported on 12/09/2022), Disp: 8 g, Rfl: 6   fluticasone (FLOVENT HFA) 110 MCG/ACT inhaler, Inhale 2 puffs into the lungs 2 (two) times daily as needed. (Patient not taking: Reported on 12/09/2022), Disp: 36 g, Rfl: 3   tadalafil (CIALIS) 10 MG tablet, Take 1 tablet (10 mg total) by mouth every other day as needed for erectile dysfunction., Disp: 10 tablet, Rfl: 1   tirzepatide (ZEPBOUND) 10 MG/0.5ML Pen, Inject 10 mg into the skin once a week., Disp: 2 mL, Rfl: 1   tirzepatide (ZEPBOUND) 10 MG/0.5ML Pen, Inject 10 mg into the skin once a week., Disp: 2 mL, Rfl: 1   tirzepatide (ZEPBOUND) 10 MG/0.5ML Pen, Inject 10  mg into the skin every 7 (seven) days., Disp: 2 mL, Rfl: 1   tirzepatide (ZEPBOUND) 2.5 MG/0.5ML Pen, Inject 2.5 mg under skin once a week 30 days., Disp: 2 mL, Rfl: 0   tirzepatide (ZEPBOUND) 5 MG/0.5ML Pen, Inject 5 mg into the skin once a week., Disp: , Rfl:    tirzepatide (ZEPBOUND) 7.5 MG/0.5ML Pen, Inject 7.5 mg into the skin once a week., Disp: 2 mL, Rfl: 0   Objective:     There were no vitals filed for this visit.    There is no height or weight on file to calculate BMI.    Physical Exam:    ***   Electronically signed by:  Robert [name] [name] 4:30 PM 07/06/23

## 2023-07-07 ENCOUNTER — Ambulatory Visit: Payer: BC Managed Care – PPO | Admitting: Sports Medicine

## 2023-07-07 VITALS — BP 106/78 | HR 79 | Ht 72.0 in | Wt 218.0 lb

## 2023-07-07 DIAGNOSIS — M47816 Spondylosis without myelopathy or radiculopathy, lumbar region: Secondary | ICD-10-CM | POA: Diagnosis not present

## 2023-07-07 DIAGNOSIS — M5441 Lumbago with sciatica, right side: Secondary | ICD-10-CM

## 2023-07-07 DIAGNOSIS — G8929 Other chronic pain: Secondary | ICD-10-CM | POA: Diagnosis not present

## 2023-07-07 DIAGNOSIS — M5136 Other intervertebral disc degeneration, lumbar region: Secondary | ICD-10-CM | POA: Diagnosis not present

## 2023-07-07 MED ORDER — MELOXICAM 15 MG PO TABS: 15.0000 mg | ORAL_TABLET | Freq: Every day | ORAL | 0 refills | Status: AC

## 2023-07-07 NOTE — Patient Instructions (Signed)
-   Start meloxicam 15 mg daily x2 weeks.  If still having pain after 2 weeks, complete 3rd-week of meloxicam. May use remaining meloxicam as needed once daily for pain control.  Do not to use additional NSAIDs while taking meloxicam.  May use Tylenol [phone] mg 2 to 3 times a day for breakthrough pain. Follow up 2 weeks after injection to discuss results  Epidural right l5-s1

## 2023-07-12 ENCOUNTER — Encounter: Payer: Self-pay | Admitting: Sports Medicine

## 2023-07-22 NOTE — Discharge Instructions (Signed)

## 2023-07-25 ENCOUNTER — Ambulatory Visit
Admission: RE | Admit: 2023-07-25 | Discharge: 2023-07-25 | Disposition: A | Discharge status: Home / Self Care | Payer: BC Managed Care – PPO | Source: Ambulatory Visit | Attending: Sports Medicine | Admitting: Sports Medicine

## 2023-07-25 DIAGNOSIS — M47816 Spondylosis without myelopathy or radiculopathy, lumbar region: Secondary | ICD-10-CM

## 2023-07-25 DIAGNOSIS — M47817 Spondylosis without myelopathy or radiculopathy, lumbosacral region: Secondary | ICD-10-CM | POA: Diagnosis not present

## 2023-07-25 DIAGNOSIS — G8929 Other chronic pain: Secondary | ICD-10-CM

## 2023-07-25 DIAGNOSIS — M5136 Other intervertebral disc degeneration, lumbar region: Secondary | ICD-10-CM

## 2023-07-25 MED ORDER — IOPAMIDOL (ISOVUE-M 200) INJECTION 41%
1.0000 mL | Freq: Once | INTRAMUSCULAR | Status: AC
  Administered 2023-07-25: 1 mL via EPIDURAL

## 2023-07-25 MED ORDER — METHYLPREDNISOLONE ACETATE 40 MG/ML INJ SUSP (RADIOLOG
80.0000 mg | Freq: Once | INTRAMUSCULAR | Status: AC
  Administered 2023-07-25: 80 mg via EPIDURAL

## 2023-08-01 ENCOUNTER — Other Ambulatory Visit (HOSPITAL_BASED_OUTPATIENT_CLINIC_OR_DEPARTMENT_OTHER): Payer: Self-pay

## 2023-08-01 ENCOUNTER — Other Ambulatory Visit: Payer: Self-pay

## 2023-08-02 DIAGNOSIS — E88819 Insulin resistance, unspecified: Secondary | ICD-10-CM | POA: Diagnosis not present

## 2023-08-02 DIAGNOSIS — E785 Hyperlipidemia, unspecified: Secondary | ICD-10-CM | POA: Diagnosis not present

## 2023-08-02 DIAGNOSIS — E559 Vitamin D deficiency, unspecified: Secondary | ICD-10-CM | POA: Diagnosis not present

## 2023-08-03 ENCOUNTER — Other Ambulatory Visit: Payer: Self-pay | Admitting: Sports Medicine

## 2023-08-24 ENCOUNTER — Other Ambulatory Visit (HOSPITAL_BASED_OUTPATIENT_CLINIC_OR_DEPARTMENT_OTHER): Payer: Self-pay

## 2023-08-24 MED ORDER — ZEPBOUND 12.5 MG/0.5ML ~~LOC~~ SOAJ
12.5000 mg | SUBCUTANEOUS | 1 refills | Status: DC
  Filled 2023-08-24: qty 2, 28d supply, fill #0
  Filled 2023-09-30: qty 2, 28d supply, fill #1

## 2023-08-29 ENCOUNTER — Encounter (HOSPITAL_BASED_OUTPATIENT_CLINIC_OR_DEPARTMENT_OTHER): Payer: Self-pay

## 2023-08-29 ENCOUNTER — Other Ambulatory Visit (HOSPITAL_BASED_OUTPATIENT_CLINIC_OR_DEPARTMENT_OTHER): Payer: Self-pay

## 2023-09-30 ENCOUNTER — Other Ambulatory Visit (HOSPITAL_BASED_OUTPATIENT_CLINIC_OR_DEPARTMENT_OTHER): Payer: Self-pay

## 2023-10-01 ENCOUNTER — Other Ambulatory Visit (HOSPITAL_BASED_OUTPATIENT_CLINIC_OR_DEPARTMENT_OTHER): Payer: Self-pay

## 2023-10-18 ENCOUNTER — Other Ambulatory Visit (HOSPITAL_BASED_OUTPATIENT_CLINIC_OR_DEPARTMENT_OTHER): Payer: Self-pay

## 2023-10-18 DIAGNOSIS — E559 Vitamin D deficiency, unspecified: Secondary | ICD-10-CM | POA: Diagnosis not present

## 2023-10-18 DIAGNOSIS — E785 Hyperlipidemia, unspecified: Secondary | ICD-10-CM | POA: Diagnosis not present

## 2023-10-18 DIAGNOSIS — E88819 Insulin resistance, unspecified: Secondary | ICD-10-CM | POA: Diagnosis not present

## 2023-10-18 MED ORDER — ZEPBOUND 12.5 MG/0.5ML ~~LOC~~ SOAJ
12.5000 mg | SUBCUTANEOUS | 1 refills | Status: DC
  Filled 2023-10-18 – 2023-10-29 (×2): qty 2, 28d supply, fill #0
  Filled 2023-11-26: qty 2, 28d supply, fill #1

## 2023-10-31 ENCOUNTER — Other Ambulatory Visit (HOSPITAL_BASED_OUTPATIENT_CLINIC_OR_DEPARTMENT_OTHER): Payer: Self-pay

## 2023-10-31 ENCOUNTER — Other Ambulatory Visit: Payer: Self-pay

## 2023-11-26 ENCOUNTER — Other Ambulatory Visit (HOSPITAL_BASED_OUTPATIENT_CLINIC_OR_DEPARTMENT_OTHER): Payer: Self-pay

## 2023-11-28 ENCOUNTER — Other Ambulatory Visit (HOSPITAL_BASED_OUTPATIENT_CLINIC_OR_DEPARTMENT_OTHER): Payer: Self-pay

## 2023-11-28 DIAGNOSIS — E663 Overweight: Secondary | ICD-10-CM | POA: Diagnosis not present

## 2023-11-28 DIAGNOSIS — E88819 Insulin resistance, unspecified: Secondary | ICD-10-CM | POA: Diagnosis not present

## 2023-11-28 DIAGNOSIS — E785 Hyperlipidemia, unspecified: Secondary | ICD-10-CM | POA: Diagnosis not present

## 2023-11-28 DIAGNOSIS — E559 Vitamin D deficiency, unspecified: Secondary | ICD-10-CM | POA: Diagnosis not present

## 2023-11-28 MED ORDER — ZEPBOUND 12.5 MG/0.5ML ~~LOC~~ SOAJ
12.5000 mg | SUBCUTANEOUS | 1 refills | Status: DC
  Filled 2023-11-28 – 2023-12-21 (×2): qty 2, 28d supply, fill #0
  Filled 2024-03-22: qty 2, 28d supply, fill #1

## 2023-11-28 MED ORDER — VITAMIN D (ERGOCALCIFEROL) 1.25 MG (50000 UNIT) PO CAPS
50000.0000 [IU] | ORAL_CAPSULE | ORAL | 2 refills | Status: AC
  Filled 2023-11-28: qty 4, 28d supply, fill #0
  Filled 2023-12-21: qty 4, 28d supply, fill #1
  Filled 2024-02-28: qty 4, 28d supply, fill #2

## 2023-12-21 ENCOUNTER — Other Ambulatory Visit: Payer: Self-pay

## 2023-12-21 ENCOUNTER — Other Ambulatory Visit (HOSPITAL_BASED_OUTPATIENT_CLINIC_OR_DEPARTMENT_OTHER): Payer: Self-pay

## 2024-01-17 ENCOUNTER — Other Ambulatory Visit (HOSPITAL_BASED_OUTPATIENT_CLINIC_OR_DEPARTMENT_OTHER): Payer: Self-pay

## 2024-01-17 DIAGNOSIS — E785 Hyperlipidemia, unspecified: Secondary | ICD-10-CM | POA: Diagnosis not present

## 2024-01-17 DIAGNOSIS — G479 Sleep disorder, unspecified: Secondary | ICD-10-CM | POA: Diagnosis not present

## 2024-01-17 DIAGNOSIS — E88819 Insulin resistance, unspecified: Secondary | ICD-10-CM | POA: Diagnosis not present

## 2024-01-17 DIAGNOSIS — E559 Vitamin D deficiency, unspecified: Secondary | ICD-10-CM | POA: Diagnosis not present

## 2024-01-17 MED ORDER — ZEPBOUND 12.5 MG/0.5ML ~~LOC~~ SOAJ
12.5000 mg | SUBCUTANEOUS | 1 refills | Status: DC
  Filled 2024-01-17: qty 2, 28d supply, fill #0
  Filled 2024-02-28: qty 2, 28d supply, fill #1

## 2024-01-30 DIAGNOSIS — D2262 Melanocytic nevi of left upper limb, including shoulder: Secondary | ICD-10-CM | POA: Diagnosis not present

## 2024-01-30 DIAGNOSIS — D225 Melanocytic nevi of trunk: Secondary | ICD-10-CM | POA: Diagnosis not present

## 2024-01-30 DIAGNOSIS — L820 Inflamed seborrheic keratosis: Secondary | ICD-10-CM | POA: Diagnosis not present

## 2024-01-30 DIAGNOSIS — D2261 Melanocytic nevi of right upper limb, including shoulder: Secondary | ICD-10-CM | POA: Diagnosis not present

## 2024-01-30 DIAGNOSIS — D485 Neoplasm of uncertain behavior of skin: Secondary | ICD-10-CM | POA: Diagnosis not present

## 2024-01-30 DIAGNOSIS — L821 Other seborrheic keratosis: Secondary | ICD-10-CM | POA: Diagnosis not present

## 2024-02-22 DIAGNOSIS — R5383 Other fatigue: Secondary | ICD-10-CM | POA: Diagnosis not present

## 2024-02-22 DIAGNOSIS — E559 Vitamin D deficiency, unspecified: Secondary | ICD-10-CM | POA: Diagnosis not present

## 2024-02-22 DIAGNOSIS — E663 Overweight: Secondary | ICD-10-CM | POA: Diagnosis not present

## 2024-02-22 DIAGNOSIS — G4700 Insomnia, unspecified: Secondary | ICD-10-CM | POA: Diagnosis not present

## 2024-02-28 ENCOUNTER — Other Ambulatory Visit (HOSPITAL_BASED_OUTPATIENT_CLINIC_OR_DEPARTMENT_OTHER): Payer: Self-pay

## 2024-02-28 DIAGNOSIS — G479 Sleep disorder, unspecified: Secondary | ICD-10-CM | POA: Diagnosis not present

## 2024-02-28 DIAGNOSIS — E559 Vitamin D deficiency, unspecified: Secondary | ICD-10-CM | POA: Diagnosis not present

## 2024-02-28 DIAGNOSIS — E88819 Insulin resistance, unspecified: Secondary | ICD-10-CM | POA: Diagnosis not present

## 2024-02-28 DIAGNOSIS — E785 Hyperlipidemia, unspecified: Secondary | ICD-10-CM | POA: Diagnosis not present

## 2024-02-28 MED ORDER — ZEPBOUND 12.5 MG/0.5ML ~~LOC~~ SOAJ
12.5000 mg | SUBCUTANEOUS | 1 refills | Status: DC
  Filled 2024-02-28 – 2024-04-23 (×2): qty 2, 28d supply, fill #0

## 2024-03-01 DIAGNOSIS — L905 Scar conditions and fibrosis of skin: Secondary | ICD-10-CM | POA: Diagnosis not present

## 2024-03-01 DIAGNOSIS — D485 Neoplasm of uncertain behavior of skin: Secondary | ICD-10-CM | POA: Diagnosis not present

## 2024-03-20 ENCOUNTER — Ambulatory Visit: Admitting: Sports Medicine

## 2024-03-20 VITALS — HR 79 | Ht 72.0 in | Wt 210.0 lb

## 2024-03-20 DIAGNOSIS — G8929 Other chronic pain: Secondary | ICD-10-CM | POA: Diagnosis not present

## 2024-03-20 DIAGNOSIS — M5136 Other intervertebral disc degeneration, lumbar region with discogenic back pain only: Secondary | ICD-10-CM | POA: Diagnosis not present

## 2024-03-20 DIAGNOSIS — M47816 Spondylosis without myelopathy or radiculopathy, lumbar region: Secondary | ICD-10-CM | POA: Diagnosis not present

## 2024-03-20 DIAGNOSIS — M5441 Lumbago with sciatica, right side: Secondary | ICD-10-CM

## 2024-03-20 NOTE — Progress Notes (Signed)
 Ben Latifah Padin D.[facility] [address] [place] [address] Phone: [phone]   Assessment and Plan:     1. Chronic bilateral low back pain with right-sided sciatica 2. Degeneration of intervertebral disc of lumbar region with discogenic back pain 3. Lumbar facet arthropathy  -Chronic with exacerbation, subsequent visit - Recurrent flare in low back pain with intermittent right-sided radicular symptoms.  This is identical to back pain with right-sided radicular symptoms that has presented in the past.  Patient has had significant relief with epidural CSI in the past including nearly 6 months relief from prior epidural on 07/25/2023.  Minimal to no relief with facet injections in the past - Recommend repeat epidural CSI to right sided L5-S1 - May use Tylenol for day-to-day pain relief and meloxicam  15 mg daily as needed for breakthrough pain relief.  Recommend limiting chronic NSAIDs to 1-2 doses per week - Continue HEP and start physical therapy.  Referral sent - Form filled out for patient at today's visit stating a hot tub/bath could be medically beneficial for chronic low back pain. - Patient has seen neurosurgery since last visit.  We do not have access to their notes, but patient states that neurosurgery recommended conservative therapy at this time  Pertinent previous records reviewed include epidural CSI procedure note 07/25/2023  Follow Up: 2 weeks after epidural to review benefit.  Visit can be done virtually   Subjective:   I, Moenique Parris, am serving as a Neurosurgeon for Doctor Ulysees Gander   Chief Complaint: low back pain    HPI:    06/15/2022 Patient is a 53 year old male complaining of low back pain. Patient states This has been a recurring issue for multiple years Usually located in bilateral lower back and then somestimes gets numbness in both of his legs. In the past he has seen a chiropractor and orthopedist for this.   Unfortunately, symptoms have gotten much worse the last few months. The pain in his back is sometimes alleviated by putting pressure in the area. He also has some pain in his right gluteus muscle groups. Pain is only noticeable when he is inactive. Pain is much worse in the morning. He has had xrays in the past but has not had any MRI. 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  Patient was unable to tolerate Flexeril  due to "hangover feeling" the next morning   08/19/2022 Patient states that he is the same had a little flare last night no ain just felt weird   09/21/2022 Patient states he is okay , been 4 1/2 weeks most of the time 90% success ,did have a flare last night he was moving christmas boxes and had a sharpe pain that triggered a prolonged pain that  treated with ib , wants  to discuss exercises     11/29/2022 Patient states he has been being good , last week started creeping back only when he is sedentary , if he keeps it warm he is good    03/21/2023 Patient states that back pain is back , last epidural didn't take pain was back in a week, wants to know if the next steps are surgery or another epidural . Core workouts help but they dont last    04/21/2023 Patient states the pain is different , in general he is having less moments of pain than before , no more dull persistent pain.   he has been experiencing flare ups, when he is  standing around he has the most pain, no pain with exercise but when he was walking though [employer] he had pain, also when he unloaded the dishwasher . He is now starting to feel some left sided glute pain. Wants to know what the next steps would be     07/07/2023 Patient states that he is flared. Will be seeing [facility] soon   03/21/2024 Patient states pain started to creep back beginning of the year   Additional pertinent review of systems negative.   Current Outpatient Medications:    albuterol  (VENTOLIN  HFA) 108 (90 Base) MCG/ACT inhaler, Inhale 2 puffs into the lungs every 6 (six) hours as needed for wheezing or shortness of breath., Disp: 8 g, Rfl: 6   fluticasone  (FLOVENT  HFA) 110 MCG/ACT inhaler, Inhale 2 puffs into the lungs 2 (two) times daily as needed., Disp: 36 g, Rfl: 3   meloxicam  (MOBIC ) 15 MG tablet, Take 1 tablet (15 mg total) by mouth daily., Disp: 30 tablet, Rfl: 0   tadalafil  (CIALIS ) 10 MG tablet, Take 1 tablet (10 mg total) by mouth every other day as needed for erectile dysfunction., Disp: 10 tablet, Rfl: 1   tirzepatide  (ZEPBOUND ) 10 MG/0.5ML Pen, Inject 10 mg into the skin once a week., Disp: 2 mL, Rfl: 1   tirzepatide  (ZEPBOUND ) 10 MG/0.5ML Pen, Inject 10 mg into the skin once a week., Disp: 2 mL, Rfl: 1   tirzepatide  (ZEPBOUND ) 10 MG/0.5ML Pen, Inject 10 mg into the skin every 7 (seven) days., Disp: 2 mL, Rfl: 1   tirzepatide  (ZEPBOUND ) 12.5 MG/0.5ML Pen, Inject 12.5 mg into the skin once a week., Disp: 2 mL, Rfl: 1   tirzepatide  (ZEPBOUND ) 12.5 MG/0.5ML Pen, Inject 12.5 mg into the skin once a week., Disp: 2 mL, Rfl: 1   tirzepatide  (ZEPBOUND ) 12.5 MG/0.5ML Pen, Inject 12.5 mg into the skin once a week., Disp: 2 mL, Rfl: 1   tirzepatide  (ZEPBOUND ) 12.5 MG/0.5ML Pen, Inject 12.5 mg into the skin once a week., Disp: 2 mL, Rfl: 1   tirzepatide  (ZEPBOUND ) 7.5 MG/0.5ML Pen, Inject 7.5 mg into the skin once a week., Disp: 2 mL, Rfl: 0   Vitamin D , Ergocalciferol , (DRISDOL )  1.25 MG (50000 UNIT) CAPS capsule, Take 1 capsule (50,000 Units total) by mouth once a week., Disp: 4 capsule, Rfl: 2   Objective:     Vitals:   03/20/24 0848  Pulse: 79  SpO2: 97%  Weight: 210 lb (95.3 kg)  Height: 6' (1.829 m)      Body mass index is 28.48 kg/m.    Physical Exam:    Gen: Appears well, nad, nontoxic and pleasant Psych: Alert and oriented, appropriate mood and affect Neuro: sensation intact, strength is 5/5 in upper and lower extremities, muscle tone wnl Skin: no susupicious lesions or rashes   Back -  Normal skin, Spine with normal alignment and no deformity.   No tenderness to vertebral process palpation.   Paraspinous muscles are not tender and without spasm Straight leg raise positive on right, negative on left Trendelenberg negative    Electronically signed by:  Marshall Skeeter D.[facility] 9:04 AM 03/20/24

## 2024-03-20 NOTE — Patient Instructions (Addendum)
 Epidural right L5-S1 Virtual visit 2 weeks after epidural to discuss results  Pt referral

## 2024-03-23 ENCOUNTER — Other Ambulatory Visit: Payer: Self-pay

## 2024-03-24 ENCOUNTER — Other Ambulatory Visit (HOSPITAL_BASED_OUTPATIENT_CLINIC_OR_DEPARTMENT_OTHER): Payer: Self-pay

## 2024-03-29 ENCOUNTER — Encounter: Payer: Self-pay | Admitting: Sports Medicine

## 2024-03-30 NOTE — Discharge Instructions (Signed)

## 2024-04-02 ENCOUNTER — Ambulatory Visit
Admission: RE | Admit: 2024-04-02 | Discharge: 2024-04-02 | Disposition: A | Discharge status: Home / Self Care | Source: Ambulatory Visit | Attending: Sports Medicine | Admitting: Sports Medicine

## 2024-04-02 DIAGNOSIS — M5136 Other intervertebral disc degeneration, lumbar region with discogenic back pain only: Secondary | ICD-10-CM

## 2024-04-02 DIAGNOSIS — M47816 Spondylosis without myelopathy or radiculopathy, lumbar region: Secondary | ICD-10-CM

## 2024-04-02 DIAGNOSIS — M47817 Spondylosis without myelopathy or radiculopathy, lumbosacral region: Secondary | ICD-10-CM | POA: Diagnosis not present

## 2024-04-02 DIAGNOSIS — G8929 Other chronic pain: Secondary | ICD-10-CM

## 2024-04-02 MED ORDER — METHYLPREDNISOLONE ACETATE 40 MG/ML INJ SUSP (RADIOLOG
80.0000 mg | Freq: Once | INTRAMUSCULAR | Status: AC
Start: 2024-04-02 — End: 2024-04-02
  Administered 2024-04-02: 80 mg via EPIDURAL

## 2024-04-02 MED ORDER — IOPAMIDOL (ISOVUE-M 200) INJECTION 41%
1.0000 mL | Freq: Once | INTRAMUSCULAR | Status: AC
  Administered 2024-04-02: 1 mL via EPIDURAL

## 2024-04-23 ENCOUNTER — Other Ambulatory Visit (HOSPITAL_BASED_OUTPATIENT_CLINIC_OR_DEPARTMENT_OTHER): Payer: Self-pay

## 2024-04-30 ENCOUNTER — Other Ambulatory Visit (HOSPITAL_BASED_OUTPATIENT_CLINIC_OR_DEPARTMENT_OTHER): Payer: Self-pay

## 2024-04-30 ENCOUNTER — Other Ambulatory Visit: Payer: Self-pay

## 2024-04-30 DIAGNOSIS — E88819 Insulin resistance, unspecified: Secondary | ICD-10-CM | POA: Diagnosis not present

## 2024-04-30 DIAGNOSIS — G479 Sleep disorder, unspecified: Secondary | ICD-10-CM | POA: Diagnosis not present

## 2024-04-30 DIAGNOSIS — E663 Overweight: Secondary | ICD-10-CM | POA: Diagnosis not present

## 2024-04-30 DIAGNOSIS — E559 Vitamin D deficiency, unspecified: Secondary | ICD-10-CM | POA: Diagnosis not present

## 2024-04-30 DIAGNOSIS — E785 Hyperlipidemia, unspecified: Secondary | ICD-10-CM | POA: Diagnosis not present

## 2024-04-30 MED ORDER — ZEPBOUND 15 MG/0.5ML ~~LOC~~ SOAJ
15.0000 mg | SUBCUTANEOUS | 2 refills | Status: AC
  Filled 2024-04-30: qty 2, 28d supply, fill #0
  Filled 2024-06-18: qty 2, 28d supply, fill #1
  Filled 2024-07-11: qty 2, 28d supply, fill #2

## 2024-06-18 ENCOUNTER — Other Ambulatory Visit (HOSPITAL_BASED_OUTPATIENT_CLINIC_OR_DEPARTMENT_OTHER): Payer: Self-pay

## 2024-06-21 ENCOUNTER — Ambulatory Visit (INDEPENDENT_AMBULATORY_CARE_PROVIDER_SITE_OTHER): Admitting: Family Medicine

## 2024-06-21 ENCOUNTER — Encounter: Payer: Self-pay | Admitting: Family Medicine

## 2024-06-21 VITALS — BP 112/64 | HR 72 | Temp 97.2°F | Ht 72.0 in | Wt 215.6 lb

## 2024-06-21 DIAGNOSIS — G4700 Insomnia, unspecified: Secondary | ICD-10-CM

## 2024-06-21 DIAGNOSIS — E785 Hyperlipidemia, unspecified: Secondary | ICD-10-CM

## 2024-06-21 DIAGNOSIS — Z0001 Encounter for general adult medical examination with abnormal findings: Secondary | ICD-10-CM

## 2024-06-21 DIAGNOSIS — E669 Obesity, unspecified: Secondary | ICD-10-CM | POA: Diagnosis not present

## 2024-06-21 DIAGNOSIS — R739 Hyperglycemia, unspecified: Secondary | ICD-10-CM | POA: Diagnosis not present

## 2024-06-21 LAB — COMPREHENSIVE METABOLIC PANEL WITH GFR
ALT: 31 U/L (ref 0–53)
AST: 22 U/L (ref 0–37)
Albumin: 4.2 g/dL (ref 3.5–5.2)
Alkaline Phosphatase: 46 U/L (ref 39–117)
BUN: 20 mg/dL (ref 6–23)
CO2: 27 meq/L (ref 19–32)
Calcium: 9 mg/dL (ref 8.4–10.5)
Chloride: 104 meq/L (ref 96–112)
Creatinine, Ser: 1.22 mg/dL (ref 0.40–1.50)
GFR: 67.51 mL/min (ref >60.00)
Glucose, Bld: 96 mg/dL (ref 70–99)
Potassium: 4.8 meq/L (ref 3.5–5.1)
Sodium: 138 meq/L (ref 135–145)
Total Bilirubin: 0.7 mg/dL (ref 0.2–1.2)
Total Protein: 7.1 g/dL (ref 6.0–8.3)

## 2024-06-21 LAB — CBC
HCT: 45.3 % (ref 39.0–52.0)
Hemoglobin: 15.3 g/dL (ref 13.0–17.0)
MCHC: 33.8 g/dL (ref 30.0–36.0)
MCV: 83.7 fl (ref 78.0–100.0)
Platelets: 243 K/uL (ref 150.0–400.0)
RBC: 5.41 Mil/uL (ref 4.22–5.81)
RDW: 13.6 % (ref 11.5–15.5)
WBC: 6.9 K/uL (ref 4.0–10.5)

## 2024-06-21 LAB — TSH: TSH: 2.4 u[IU]/mL (ref 0.35–5.50)

## 2024-06-21 LAB — LIPID PANEL
Cholesterol: 172 mg/dL (ref 0–200)
HDL: 38.1 mg/dL — ABNORMAL LOW (ref >39.00)
LDL Cholesterol: 110 mg/dL — ABNORMAL HIGH (ref 0–99)
NonHDL: 134.22
Total CHOL/HDL Ratio: 5
Triglycerides: 120 mg/dL (ref 0.0–149.0)
VLDL: 24 mg/dL (ref 0.0–40.0)

## 2024-06-21 LAB — HEMOGLOBIN A1C: Hgb A1c MFr Bld: 5.3 % (ref 4.6–6.5)

## 2024-06-21 NOTE — Assessment & Plan Note (Signed)
 Check lipids. Discussed lifestyle modifications.

## 2024-06-21 NOTE — Patient Instructions (Signed)
 It was very nice to see you today!  VISIT SUMMARY: You came in for your annual physical exam. We discussed your ongoing weight loss, insomnia, and preventative health measures. Blood work was ordered, and we talked about vaccinations and screenings.  YOUR PLAN: ADULT WELLNESS VISIT: You attended your annual wellness visit with no acute concerns. -We discussed preventative health measures, including vaccinations and screenings. -Blood work for cholesterol and A1c was ordered. -We discussed pneumonia and shingles vaccines. -Your Cologuard screening is up to date for next year.  INSOMNIA: You have chronic insomnia with difficulty staying asleep. -We discussed alternative over-the-counter options for sleep, with Benadryl recommended due to its lower potency. -Try over-the-counter Benadryl for sleep. -If Benadryl is ineffective, consider low-dose hydroxyzine  (10 mg). -Monitor your sleep patterns and report if issues persist.  OBESITY: You are managing obesity with slow, steady weight loss since November 2023. -Continue your current weight management plan with [employer]. -Monitor your weight and adjust the plan as necessary.  Return in about 1 year (around 06/21/2025) for Annual Physical.   Take care, Dr Kennyth  PLEASE NOTE:  If you had any lab tests, please let us  know if you have not heard back within a few days. You may see your results on mychart before we have a chance to review them but we will give you a call once they are reviewed by us .   If we ordered any referrals today, please let us  know if you have not heard from their office within the next week.   If you had any urgent prescriptions sent in today, please check with the pharmacy within an hour of our visit to make sure the prescription was transmitted appropriately.   Please try these tips to maintain a healthy lifestyle:  Eat at least 3 REAL meals and 1-2 snacks per day.  Aim for no more than 5 hours between eating.   If you eat breakfast, please do so within one hour of getting up.   Each meal should contain half fruits/vegetables, one quarter protein, and one quarter carbs (no bigger than a computer mouse)  Cut down on sweet beverages. This includes juice, soda, and sweet tea.   Drink at least 1 glass of water with each meal and aim for at least 8 glasses per day  Exercise at least 150 minutes every week.    Preventive Care 42-87 Years Old, Male Preventive care refers to lifestyle choices and visits with your health care provider that can promote health and wellness. Preventive care visits are also called wellness exams. What can I expect for my preventive care visit? Counseling During your preventive care visit, your health care provider may ask about your: Medical history, including: Past medical problems. Family medical history. Current health, including: Emotional well-being. Home life and relationship well-being. Sexual activity. Lifestyle, including: Alcohol, nicotine or tobacco, and drug use. Access to firearms. Diet, exercise, and sleep habits. Safety issues such as seatbelt and bike helmet use. Sunscreen use. Work and work Astronomer. Physical exam Your health care provider will check your: Height and weight. These may be used to calculate your BMI (body mass index). BMI is a measurement that tells if you are at a healthy weight. Waist circumference. This measures the distance around your waistline. This measurement also tells if you are at a healthy weight and may help predict your risk of certain diseases, such as type 2 diabetes and high blood pressure. Heart rate and blood pressure. Body temperature. Skin for abnormal  spots. What immunizations do I need?  Vaccines are usually given at various ages, according to a schedule. Your health care provider will recommend vaccines for you based on your age, medical history, and lifestyle or other factors, such as travel or where you  work. What tests do I need? Screening Your health care provider may recommend screening tests for certain conditions. This may include: Lipid and cholesterol levels. Diabetes screening. This is done by checking your blood sugar (glucose) after you have not eaten for a while (fasting). Hepatitis B test. Hepatitis C test. HIV (human immunodeficiency virus) test. STI (sexually transmitted infection) testing, if you are at risk. Lung cancer screening. Prostate cancer screening. Colorectal cancer screening. Talk with your health care provider about your test results, treatment options, and if necessary, the need for more tests. Follow these instructions at home: Eating and drinking  Eat a diet that includes fresh fruits and vegetables, whole grains, lean protein, and low-fat dairy products. Take vitamin and mineral supplements as recommended by your health care provider. Do not drink alcohol if your health care provider tells you not to drink. If you drink alcohol: Limit how much you have to 0-2 drinks a day. Know how much alcohol is in your drink. In the [place] one drink equals one 12 oz bottle of beer (355 mL), one 5 oz glass of wine (148 mL), or one 1 oz glass of hard liquor (44 mL). Lifestyle Brush your teeth every morning and night with fluoride toothpaste. Floss one time each day. Exercise for at least 30 minutes 5 or more days each week. Do not use any products that contain nicotine or tobacco. These products include cigarettes, chewing tobacco, and vaping devices, such as e-cigarettes. If you need help quitting, ask your health care provider. Do not use drugs. If you are sexually active, practice safe sex. Use a condom or other form of protection to prevent STIs. Take aspirin only as told by your health care provider. Make sure that you understand how much to take and what form to take. Work with your health care provider to find out whether it is safe and beneficial for you to take  aspirin daily. Find healthy ways to manage stress, such as: Meditation, yoga, or listening to music. Journaling. Talking to a trusted person. Spending time with friends and family. Minimize exposure to UV radiation to reduce your risk of skin cancer. Safety Always wear your seat belt while driving or riding in a vehicle. Do not drive: If you have been drinking alcohol. Do not ride with someone who has been drinking. When you are tired or distracted. While texting. If you have been using any mind-altering substances or drugs. Wear a helmet and other protective equipment during sports activities. If you have firearms in your house, make sure you follow all gun safety procedures. What's next? Go to your health care provider once a year for an annual wellness visit. Ask your health care provider how often you should have your eyes and teeth checked. Stay up to date on all vaccines. This information is not intended to replace advice given to you by your health care provider. Make sure you discuss any questions you have with your health care provider. Document Revised: 04/15/2021 Document Reviewed: 04/15/2021 Elsevier Patient Education  2024 [employer].

## 2024-06-21 NOTE — Progress Notes (Signed)
 Chief Complaint:  Robert [name] is a 53 y.o. male who presents today for his annual comprehensive physical exam.    Assessment/Plan:  Chronic Problems Addressed Today: Obesity Following with medical weight management.  Of Zepbound  15 mg weekly.  Doing well with this.  He is slowly losing weight.  Dyslipidemia Check lipids.  Discussed lifestyle modifications.  Hyperglycemia Check A1c.  Discussed lifestyle modifications.  Insomnia Symptoms are not controlled he has had bad reactions to hydroxyzine  in the past.  We did discuss over-the-counter medications as well.  He can try low-dose over-the-counter Benadryl.  He will let us  know how this is working in a few weeks.  If he does not do well with this would consider very low-dose hydroxyzine  5 to 10 mg nightly.   Preventative Healthcare: Check labs.  Due for colon cancer screening next year.  Due for shingles and pneumonia vaccine however deferred for today.  Patient Counseling(The following topics were reviewed and/or handout was given):  -Nutrition: Stressed importance of moderation in sodium/caffeine intake, saturated fat and cholesterol, caloric balance, sufficient intake of fresh fruits, vegetables, and fiber.  -Stressed the importance of regular exercise.   -Substance Abuse: Discussed cessation/primary prevention of tobacco, alcohol, or other drug use; driving or other dangerous activities under the influence; availability of treatment for abuse.   -Injury prevention: Discussed safety belts, safety helmets, smoke detector, smoking near bedding or upholstery.   -Sexuality: Discussed sexually transmitted diseases, partner selection, use of condoms, avoidance of unintended pregnancy and contraceptive alternatives.   -Dental health: Discussed importance of regular tooth brushing, flossing, and dental visits.  -Health maintenance and immunizations reviewed. Please refer to Health maintenance section.  Return to care in 1 year for  next preventative visit.     Subjective:  HPI:  He has no acute complaints today. Patient is here today for his annual physical.  See assessment / plan for status of chronic conditions.  Discussed the use of AI scribe software for clinical note transcription with the patient, who gave verbal consent to proceed.  History of Present Illness Robert [name] is a 53 year old male who presents for an annual physical exam.  He has been experiencing a slow but steady weight loss since November 2023, losing approximately 30 pounds over nearly two years. He is working with [employer] for [employer] and has been working with Doctor [employer] regarding muscle loss.  He has ongoing insomnia, characterized by difficulty staying asleep. Previously prescribed hydroxyzine  at a low dose of 25 mg, he found it too sedating, impacting his job performance. He did not pursue a sleep study as he does not exhibit symptoms of sleep apnea. He is currently managing his sleep issues without medication, aiming for six hours of sleep per night but rarely achieving this. He often wakes up after 90 minutes of sleep and sometimes gets less than four hours of sleep per night. No issues with falling asleep and no symptoms of sleep apnea.  He is approaching his typical allergy season, which varies year to year, and sometimes experiences asthma symptoms depending on the season. He has not reported any current issues with allergies or asthma at this time.  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   Lifestyle Diet: Working with medical weight management.  Try to get plenty of fruits and veggies.     06/21/2024    8:26 AM  Depression screen PHQ 2/9  Decreased  Interest 0  Down, Depressed, Hopeless 0  PHQ - 2 Score 0    Health Maintenance Due  Topic Date Due   Hepatitis B Vaccines 19-59 Average Risk (1 of 3 - 19+ 3-dose series) Never done     ROS:  Per HPI, otherwise a complete review of systems was negative.   PMH:  The following were reviewed and entered/updated in epic: Past Medical History:  Diagnosis Date   Asthma dx aprox 2002   exercise induced , rarely has sx    Seasonal allergies    Patient Active Problem List   Diagnosis Date Noted   Erectile dysfunction 12/09/2022   Obesity 11/06/2021   Chronic cough 01/12/2021   Insomnia 06/12/2019   Dyslipidemia 05/09/2018   Hyperglycemia 05/09/2018   Chronic low back pain with sciatica 05/09/2018   Past Surgical History:  Procedure Laterality Date   VASECTOMY  2013    Family History  Problem Relation Age of Onset   Arthritis Other    Hyperlipidemia Other    Heart disease Other        no early dz    Hypertension Other    Rheum arthritis Mother    Dystonia Mother    COPD Father    Alcohol abuse Father    Colon cancer Neg Hx    Prostate cancer Neg Hx     Medications- reviewed and updated Current Outpatient Medications  Medication Sig Dispense Refill   albuterol  (VENTOLIN  HFA) 108 (90 Base) MCG/ACT inhaler Inhale 2 puffs into the lungs every 6 (six) hours as needed for wheezing or shortness of breath. 8 g 6   fluticasone  (FLOVENT  HFA) 110 MCG/ACT inhaler Inhale 2 puffs into the lungs 2 (two) times daily as needed. 36 g 3   meloxicam  (MOBIC ) 15 MG tablet Take 1 tablet (15 mg total) by mouth daily. 30 tablet 0   tadalafil  (CIALIS ) 10 MG tablet Take 1 tablet (10 mg total) by mouth every other day as needed for erectile dysfunction. 10 tablet 1   tirzepatide  (ZEPBOUND ) 15 MG/0.5ML Pen Inject 15 mg into the skin once a week. 2 mL 2   Vitamin D , Ergocalciferol , (DRISDOL ) 1.25 MG (50000 UNIT) CAPS capsule Take 1 capsule (50,000 Units total) by mouth once a week. 4 capsule 2   No current facility-administered medications for this visit.    Allergies-reviewed and updated No Known Allergies  Social History   Socioeconomic History   Marital status: Married    Spouse  name: Not on file   Number of children: 2   Years of education: Not on file   Highest education level: Professional school degree (e.g., MD, DDS, DVM, JD)  Occupational History   Occupation: attorney (immigration)  Tobacco Use   Smoking status: Never   Smokeless tobacco: Never  Vaping Use   Vaping status: Never Used  Substance and Sexual Activity   Alcohol use: Yes    Comment: rarely    Drug use: No   Sexual activity: Yes  Other Topics Concern   Not on file  Social History Narrative   Not on file   Social Drivers of Health   Financial Resource Strain: Low Risk  (06/20/2024)   Overall Financial Resource Strain (CARDIA)    Difficulty of Paying Living Expenses: Not hard at all  Food Insecurity: No Food Insecurity (06/20/2024)   Hunger Vital Sign    Worried About Running Out of Food in the Last Year: Never true    Ran Out of Food in  the Last Year: Never true  Transportation Needs: No Transportation Needs (06/20/2024)   PRAPARE - Administrator, Civil Service (Medical): No    Lack of Transportation (Non-Medical): No  Physical Activity: Insufficiently Active (06/20/2024)   Exercise Vital Sign    Days of Exercise per Week: 3 days    Minutes of Exercise per Session: 30 min  Stress: Stress Concern Present (06/20/2024)   [employer] of Occupational Health - Occupational Stress Questionnaire    Feeling of Stress: Rather much  Social Connections: Moderately Integrated (06/20/2024)   Social Connection and Isolation Panel    Frequency of Communication with Friends and Family: More than three times a week    Frequency of Social Gatherings with Friends and Family: More than three times a week    Attends Religious Services: 1 to 4 times per year    Active Member of [employer] or Organizations: No    Attends Engineer, structural: Not on file    Marital Status: Married        Objective:  Physical Exam: BP 112/64   Pulse 72   Temp (!) 97.2 F (36.2 C) (Temporal)    Ht 6' (1.829 m)   Wt 215 lb 9.6 oz (97.8 kg)   SpO2 98%   BMI 29.24 kg/m   Body mass index is 29.24 kg/m. Wt Readings from Last 3 Encounters:  06/21/24 215 lb 9.6 oz (97.8 kg)  03/20/24 210 lb (95.3 kg)  07/07/23 218 lb (98.9 kg)   Gen: NAD, resting comfortably HEENT: TMs normal bilaterally. OP clear. No thyromegaly noted.  CV: RRR with no murmurs appreciated Pulm: NWOB, CTAB with no crackles, wheezes, or rhonchi GI: Normal bowel sounds present. Soft, Nontender, Nondistended. MSK: no edema, cyanosis, or clubbing noted Skin: warm, dry Neuro: CN2-12 grossly intact. Strength 5/5 in upper and lower extremities. Reflexes symmetric and intact bilaterally.  Psych: Normal affect and thought content     Teirra Carapia M. Kennyth, MD 06/21/2024 8:48 AM

## 2024-06-21 NOTE — Assessment & Plan Note (Signed)
 Following with medical weight management.  Of Zepbound  15 mg weekly.  Doing well with this.  He is slowly losing weight.

## 2024-06-21 NOTE — Assessment & Plan Note (Signed)
 Symptoms are not controlled he has had bad reactions to hydroxyzine  in the past.  We did discuss over-the-counter medications as well.  He can try low-dose over-the-counter Benadryl.  He will let us  know how this is working in a few weeks.  If he does not do well with this would consider very low-dose hydroxyzine  5 to 10 mg nightly.

## 2024-06-21 NOTE — Assessment & Plan Note (Signed)
 Check A1c.  Discussed lifestyle modifications.

## 2024-06-22 ENCOUNTER — Ambulatory Visit: Payer: Self-pay | Admitting: Family Medicine

## 2024-06-22 DIAGNOSIS — E785 Hyperlipidemia, unspecified: Secondary | ICD-10-CM

## 2024-06-22 NOTE — Progress Notes (Signed)
 Cholesterol is a little bit elevated but stable compared to previous values.  All of his other labs are at goal.  Do not need to make any changes to treatment plan.  He should continue to work on diet and exercise and we can recheck again in a year or so.

## 2024-06-25 ENCOUNTER — Ambulatory Visit: Admitting: Sports Medicine

## 2024-07-03 ENCOUNTER — Other Ambulatory Visit (HOSPITAL_BASED_OUTPATIENT_CLINIC_OR_DEPARTMENT_OTHER): Payer: Self-pay

## 2024-07-03 DIAGNOSIS — E785 Hyperlipidemia, unspecified: Secondary | ICD-10-CM | POA: Diagnosis not present

## 2024-07-03 DIAGNOSIS — E88819 Insulin resistance, unspecified: Secondary | ICD-10-CM | POA: Diagnosis not present

## 2024-07-03 DIAGNOSIS — R7989 Other specified abnormal findings of blood chemistry: Secondary | ICD-10-CM | POA: Diagnosis not present

## 2024-07-03 DIAGNOSIS — G479 Sleep disorder, unspecified: Secondary | ICD-10-CM | POA: Diagnosis not present

## 2024-07-03 DIAGNOSIS — E291 Testicular hypofunction: Secondary | ICD-10-CM | POA: Diagnosis not present

## 2024-07-03 DIAGNOSIS — E559 Vitamin D deficiency, unspecified: Secondary | ICD-10-CM | POA: Diagnosis not present

## 2024-07-03 MED ORDER — ZEPBOUND 15 MG/0.5ML ~~LOC~~ SOAJ
15.0000 mg | SUBCUTANEOUS | 2 refills | Status: AC
  Filled 2024-07-03 – 2024-08-13 (×2): qty 2, 28d supply, fill #0
  Filled 2024-10-07: qty 2, 28d supply, fill #1
  Filled 2024-11-05 – 2024-11-10 (×2): qty 2, 28d supply, fill #2

## 2024-07-09 ENCOUNTER — Other Ambulatory Visit: Payer: Self-pay | Admitting: *Deleted

## 2024-07-09 ENCOUNTER — Encounter: Payer: Self-pay | Admitting: Family Medicine

## 2024-07-09 MED ORDER — COVID-19 MRNA VACC (MODERNA) 50 MCG/0.5ML IM SUSP: 0.5000 mL | Freq: Once | INTRAMUSCULAR | 0 refills | Status: AC

## 2024-07-11 ENCOUNTER — Other Ambulatory Visit (HOSPITAL_BASED_OUTPATIENT_CLINIC_OR_DEPARTMENT_OTHER): Payer: Self-pay

## 2024-07-14 ENCOUNTER — Other Ambulatory Visit (HOSPITAL_BASED_OUTPATIENT_CLINIC_OR_DEPARTMENT_OTHER): Payer: Self-pay

## 2024-07-16 ENCOUNTER — Encounter: Payer: Self-pay | Admitting: Family Medicine

## 2024-07-17 NOTE — Telephone Encounter (Signed)
 Please advice

## 2024-07-18 NOTE — Telephone Encounter (Signed)
 I can see is scanned in results.  The testosterone  is not low enough for him to qualify for testosterone  placement at this time.  It is ok to send in a refill on his Cialis .

## 2024-07-19 ENCOUNTER — Other Ambulatory Visit: Payer: Self-pay | Admitting: *Deleted

## 2024-07-19 MED ORDER — TADALAFIL 10 MG PO TABS: 10.0000 mg | ORAL_TABLET | ORAL | 1 refills | Status: AC | PRN

## 2024-08-13 ENCOUNTER — Other Ambulatory Visit (HOSPITAL_BASED_OUTPATIENT_CLINIC_OR_DEPARTMENT_OTHER): Payer: Self-pay

## 2024-08-27 ENCOUNTER — Other Ambulatory Visit (HOSPITAL_BASED_OUTPATIENT_CLINIC_OR_DEPARTMENT_OTHER): Payer: Self-pay

## 2024-08-27 DIAGNOSIS — G479 Sleep disorder, unspecified: Secondary | ICD-10-CM | POA: Diagnosis not present

## 2024-08-27 DIAGNOSIS — E88819 Insulin resistance, unspecified: Secondary | ICD-10-CM | POA: Diagnosis not present

## 2024-08-27 DIAGNOSIS — E785 Hyperlipidemia, unspecified: Secondary | ICD-10-CM | POA: Diagnosis not present

## 2024-08-27 DIAGNOSIS — E559 Vitamin D deficiency, unspecified: Secondary | ICD-10-CM | POA: Diagnosis not present

## 2024-08-27 MED ORDER — ZEPBOUND 15 MG/0.5ML ~~LOC~~ SOAJ
15.0000 mg | SUBCUTANEOUS | 2 refills | Status: AC
  Filled 2024-08-27 – 2024-09-12 (×2): qty 2, 28d supply, fill #0
  Filled 2024-12-03: qty 2, 28d supply, fill #1

## 2024-08-28 ENCOUNTER — Encounter: Admitting: Family Medicine

## 2024-09-12 ENCOUNTER — Other Ambulatory Visit (HOSPITAL_BASED_OUTPATIENT_CLINIC_OR_DEPARTMENT_OTHER): Payer: Self-pay

## 2024-10-08 ENCOUNTER — Other Ambulatory Visit (HOSPITAL_BASED_OUTPATIENT_CLINIC_OR_DEPARTMENT_OTHER): Payer: Self-pay

## 2024-11-06 ENCOUNTER — Other Ambulatory Visit (HOSPITAL_BASED_OUTPATIENT_CLINIC_OR_DEPARTMENT_OTHER): Payer: Self-pay

## 2024-11-08 ENCOUNTER — Other Ambulatory Visit (HOSPITAL_BASED_OUTPATIENT_CLINIC_OR_DEPARTMENT_OTHER): Payer: Self-pay

## 2024-11-10 ENCOUNTER — Other Ambulatory Visit (HOSPITAL_BASED_OUTPATIENT_CLINIC_OR_DEPARTMENT_OTHER): Payer: Self-pay

## 2024-11-13 ENCOUNTER — Other Ambulatory Visit (HOSPITAL_BASED_OUTPATIENT_CLINIC_OR_DEPARTMENT_OTHER): Payer: Self-pay

## 2024-12-04 ENCOUNTER — Other Ambulatory Visit (HOSPITAL_BASED_OUTPATIENT_CLINIC_OR_DEPARTMENT_OTHER): Payer: Self-pay

## 2024-12-07 ENCOUNTER — Other Ambulatory Visit (HOSPITAL_BASED_OUTPATIENT_CLINIC_OR_DEPARTMENT_OTHER): Payer: Self-pay

## 2025-06-24 ENCOUNTER — Encounter: Admitting: Family Medicine
